# Patient Record
Sex: Female | Born: 1940 | Race: White | Hispanic: No | Smoking: Never smoker
Health system: Southern US, Community
[De-identification: ages and names within clinical notes are randomized; demographics above are authoritative.]

## PROBLEM LIST (undated history)

## (undated) DIAGNOSIS — S82002A Unspecified fracture of left patella, initial encounter for closed fracture: Secondary | ICD-10-CM

## (undated) DIAGNOSIS — Z87898 Personal history of other specified conditions: Secondary | ICD-10-CM

## (undated) DIAGNOSIS — K649 Unspecified hemorrhoids: Secondary | ICD-10-CM

## (undated) DIAGNOSIS — I10 Essential (primary) hypertension: Secondary | ICD-10-CM

## (undated) DIAGNOSIS — M1990 Unspecified osteoarthritis, unspecified site: Secondary | ICD-10-CM

## (undated) DIAGNOSIS — J45909 Unspecified asthma, uncomplicated: Secondary | ICD-10-CM

## (undated) DIAGNOSIS — S060XAA Concussion with loss of consciousness status unknown, initial encounter: Secondary | ICD-10-CM

## (undated) DIAGNOSIS — S060X9A Concussion with loss of consciousness of unspecified duration, initial encounter: Secondary | ICD-10-CM

## (undated) HISTORY — PX: WISDOM TOOTH EXTRACTION: SHX21

## (undated) HISTORY — PX: NASAL SEPTOPLASTY W/ TURBINOPLASTY: SHX2070

## (undated) HISTORY — PX: BREAST SURGERY: SHX581

## (undated) HISTORY — PX: HEMORRHOID BANDING: SHX5850

## (undated) HISTORY — PX: COLONOSCOPY: SHX174

---

## 2015-03-07 ENCOUNTER — Ambulatory Visit: Payer: Self-pay | Admitting: Orthopedic Surgery

## 2015-03-07 NOTE — Progress Notes (Signed)
Preoperative surgical orders have been place into the Epic hospital system for Darlene Johnson on 03/07/2015, 10:24 AM  by Patrica Duel for surgery on 03-20-2015.  Preop Total Hip - Anterior Approach orders including IV Tylenol, and IV Decadron as long as there are no contraindications to the above medications. Darlene Peace, PA-C

## 2015-03-10 ENCOUNTER — Ambulatory Visit: Payer: Self-pay | Admitting: Orthopedic Surgery

## 2015-03-10 NOTE — H&P (Signed)
Darlene Johnson DOB: February 12, 1940 Divorced / Language: English / Race: White Female Date of Admission:  03/20/2015 CC:  Right Hip Pain History of Present Illness  The patient is a 75 year old female who comes in for a preoperative History and Physical. The patient is scheduled for a right total hip arthroplasty (anterior) to be performed by Dr. Gus Rankin. Aluisio, MD at [facility] on 03-20-2015. The patient is a 75 year old female who presents with a hip problem. The patient reports right hip problems including pain and stiffness symptoms that have been present for year(s). The symptoms began without any known injury. Symptoms reported include hip pain, difficulty flexing hip, difficulty rotating hip and difficulty ambulating (especially with steps) The patient reports symptoms radiating to the: right thigh anteriorly and lower back and right flank area.The patient feels as if their symptoms are does feel they are worsening. Current treatment includes nonsteroidal anti-inflammatory drugs (Advil, prn). Prior to being seen today the patient was previously evaluated by a colleague Fish farm manager) 3 year(s) ago. Previous workup for this problem has included hip x-rays. Previous treatment for this problem has included physical therapy (she states she has continued with HEP). Unfortunately, right hip hs gotten progressively worse. It is at a point where it is bothering her at all times. She is having a much more difficult time getting around and doing things she desires. She is even having more difficulty with activities of daily living. Left hip bothers her also, but to much lesser degree. She has been told in the past that she needed hip replacement. She has been seen and felt to benefit from surgery. She would like to proceed with surgery at this time. They have been treated conservatively in the past for the above stated problem and despite conservative measures, they continue to have progressive pain  and severe functional limitations and dysfunction. They have failed non-operative management including home exercise, medications. It is felt that they would benefit from undergoing total joint replacement. Risks and benefits of the procedure have been discussed with the patient and they elect to proceed with surgery. There are no active contraindications to surgery such as ongoing infection or rapidly progressive neurological disease.  Problem List/Past Medical Primary osteoarthritis of right hip (M16.11)  Cardiac Arrhythmia  Palpitations  High blood pressure  Asthma  Childhood Hemorrhoids - Internal Menopause  Measles  Left Leg Fracture  Childhood  Allergies No Known Drug Allergies  Family History  Cerebrovascular Accident  Maternal Grandmother, Paternal Grandfather. Heart Disease  Paternal Grandmother. Hypertension  Father, Paternal Emelia Loron, Paternal Grandmother. Osteoarthritis  Father, Paternal Grandmother.  Social History  Children  1 Current drinker  11/01/2014: Currently drinks wine only occasionally per week Current work status  retired Scientist, physiological daily; does other Living situation  live alone Marital status  divorced No history of drug/alcohol rehab  Not under pain contract  Number of flights of stairs before winded  2-3 Tobacco / smoke exposure  11/01/2014: no Tobacco use  Never smoker. 11/01/2014 Post-Surgical Plans Home Advance Directives  Living Will  Medication History Hydrochlorothiazide (  Tablet, Oral) Active. Klor-Con M20 ( Tablet ER, Oral) Active. Advil (  Tablet, Oral) Active. Stool Softener (Oral) Specific strength unknown - Active. Cetirizine HCl (  Tablet, Oral) Active. Glucosamine (Oral) Specific strength unknown - Active. Flax Seed Oil (  Capsule, Oral) Active. Calcium+D3 Gradual Release (Oral) Specific strength unknown - Active. Vitamin D3 (1000UNIT Tablet, Oral)  Active. Turmeric (Oral) Specific strength unknown - Active. Metamucil (  Oral) Specific strength unknown - Active. Vitamin B12 TR (1000MCG Tablet ER, Oral) Active.  Past Surgical Histor Breast Biopsy  Date: 1975. left Sinus Surgery  Date: 2013. Septoplasty   Review of Systems General Not Present- Chills, Fatigue, Fever, Memory Loss, Night Sweats, Weight Gain and Weight Loss. Skin Not Present- Eczema, Hives, Itching, Lesions and Rash. HEENT Not Present- Dentures, Double Vision, Headache, Hearing Loss, Tinnitus and Visual Loss. Respiratory Present- Allergies. Not Present- Chronic Cough, Coughing up blood, Shortness of breath at rest and Shortness of breath with exertion. Cardiovascular Not Present- Chest Pain, Difficulty Breathing Lying Down, Murmur, Palpitations, Racing/skipping heartbeats and Swelling. Gastrointestinal Not Present- Abdominal Pain, Bloody Stool, Constipation, Diarrhea, Difficulty Swallowing, Heartburn, Jaundice, Loss of appetitie, Nausea and Vomiting. Female Genitourinary Present- Urinating at Night. Not Present- Blood in Urine, Discharge, Flank Pain, Incontinence, Painful Urination, Urgency, Urinary frequency, Urinary Retention and Weak urinary stream. Musculoskeletal Present- Back Pain and Joint Pain. Not Present- Joint Swelling, Morning Stiffness, Muscle Pain, Muscle Weakness and Spasms. Neurological Not Present- Blackout spells, Difficulty with balance, Dizziness, Paralysis, Tremor and Weakness. Psychiatric Not Present- Insomnia.  Vitals Weight: 165 lb Height: 62.5in Weight was reported by patient. Height was reported by patient. Body Surface Area: 1.77 m Body Mass Index: 29.7 kg/m  BP: 128/68 (Sitting, Right Arm, Standard)   Physical Exam General Mental Status -Alert, cooperative and good historian. General Appearance-pleasant, Not in acute distress. Orientation-Oriented X3. Build & Nutrition-Well nourished and Well developed.  Head and  Neck Head-normocephalic, atraumatic . Neck Global Assessment - supple, no bruit auscultated on the right, no bruit auscultated on the left.  Eye Pupil - Bilateral-Regular and Round. Motion - Bilateral-EOMI.  Chest and Lung Exam Auscultation Breath sounds - clear at anterior chest wall and clear at posterior chest wall. Adventitious sounds - No Adventitious sounds.  Cardiovascular Auscultation Rhythm - Regular rate and rhythm. Heart Sounds - S1 WNL and S2 WNL. Murmurs & Other Heart Sounds - Auscultation of the heart reveals - No Murmurs.  Abdomen Palpation/Percussion Tenderness - Abdomen is non-tender to palpation. Rigidity (guarding) - Abdomen is soft. Auscultation Auscultation of the abdomen reveals - Bowel sounds normal.  Female Genitourinary Note: Not done, not pertinent to present illness   Musculoskeletal Note: On exam, a well-developed female, alert and oriented, in no apparent distress. Evaluation of her left hip flexion about 110, rotation in 10, out 30, abduction 30 with discomfort on range of motion. Right hip flexion is about 90. No internal or external rotation and about 5 degrees of abduction. Her knee exams are normal. Pulses, sensation and motor are intact. Gait pattern is significantly antalgic.  RADIOGRAPHS AP pelvis and lateral of the right hip show advanced end-stage arthritis of that right hip, bone-on-bone with subchondral cystic formation and large osteophyte formation. She also has degenerative change on the left, but to a lesser degree.  Assessment & Plan  Primary osteoarthritis of right hip (M16.11)  Note:Surgical Plans: Right Total Hip Replacement - Anterior Approach  Disposition: Home - Patient lives in [place], [place].  PCP: Dr. Jannach - Patient has been seen preoperatively and felt to be stable for surgery. Cards: Dr. Miller - Patient has been seen preoperatively and felt to be stable for surgery. "she is low risk for a cardiac event  int he next year and can go ahead and have her hip surgery as indicated."  IV TXA  Anesthesia Issues: Nausea and vomiting following anesthesia in the past.  Signed electronically by Brayden Betters L Tanza Pellot, III PA-C 

## 2015-03-13 ENCOUNTER — Encounter (HOSPITAL_COMMUNITY)
Admission: RE | Admit: 2015-03-13 | Discharge: 2015-03-13 | Disposition: A | Discharge status: Home / Self Care | Payer: Medicare Other | Source: Ambulatory Visit | Attending: Orthopedic Surgery | Admitting: Orthopedic Surgery

## 2015-03-13 ENCOUNTER — Encounter (HOSPITAL_COMMUNITY): Payer: Self-pay

## 2015-03-13 DIAGNOSIS — Z01812 Encounter for preprocedural laboratory examination: Secondary | ICD-10-CM | POA: Insufficient documentation

## 2015-03-13 DIAGNOSIS — Z0183 Encounter for blood typing: Secondary | ICD-10-CM | POA: Insufficient documentation

## 2015-03-13 DIAGNOSIS — I10 Essential (primary) hypertension: Secondary | ICD-10-CM | POA: Insufficient documentation

## 2015-03-13 DIAGNOSIS — M1611 Unilateral primary osteoarthritis, right hip: Secondary | ICD-10-CM | POA: Insufficient documentation

## 2015-03-13 DIAGNOSIS — Z79899 Other long term (current) drug therapy: Secondary | ICD-10-CM | POA: Diagnosis not present

## 2015-03-13 HISTORY — DX: Unspecified hemorrhoids: K64.9

## 2015-03-13 HISTORY — DX: Personal history of other specified conditions: Z87.898

## 2015-03-13 HISTORY — DX: Unspecified asthma, uncomplicated: J45.909

## 2015-03-13 HISTORY — DX: Unspecified osteoarthritis, unspecified site: M19.90

## 2015-03-13 HISTORY — DX: Essential (primary) hypertension: I10

## 2015-03-13 HISTORY — DX: Concussion with loss of consciousness of unspecified duration, initial encounter: S06.0X9A

## 2015-03-13 HISTORY — DX: Concussion with loss of consciousness status unknown, initial encounter: S06.0XAA

## 2015-03-13 HISTORY — DX: Unspecified fracture of left patella, initial encounter for closed fracture: S82.002A

## 2015-03-13 LAB — COMPREHENSIVE METABOLIC PANEL
ALBUMIN: 4.3 g/dL (ref 3.5–5.0)
ALT: 17 U/L (ref 14–54)
ANION GAP: 8 (ref 5–15)
AST: 24 U/L (ref 15–41)
Alkaline Phosphatase: 85 U/L (ref 38–126)
BUN: 17 mg/dL (ref 6–20)
CO2: 28 mmol/L (ref 22–32)
Calcium: 9.2 mg/dL (ref 8.9–10.3)
Chloride: 103 mmol/L (ref 101–111)
Creatinine, Ser: 0.76 mg/dL (ref 0.44–1.00)
GFR calc Af Amer: >60 mL/min (ref >60)
GFR calc non Af Amer: >60 mL/min (ref >60)
GLUCOSE: 101 mg/dL — AB (ref 65–99)
POTASSIUM: 4.3 mmol/L (ref 3.5–5.1)
SODIUM: 139 mmol/L (ref 135–145)
Total Bilirubin: 0.8 mg/dL (ref 0.3–1.2)
Total Protein: 6.9 g/dL (ref 6.5–8.1)

## 2015-03-13 LAB — CBC
HCT: 47.1 % — ABNORMAL HIGH (ref 36.0–46.0)
HEMOGLOBIN: 15.4 g/dL — AB (ref 12.0–15.0)
MCH: 28.8 pg (ref 26.0–34.0)
MCHC: 32.7 g/dL (ref 30.0–36.0)
MCV: 88.2 fL (ref 78.0–100.0)
Platelets: 175 10*3/uL (ref 150–400)
RBC: 5.34 MIL/uL — ABNORMAL HIGH (ref 3.87–5.11)
RDW: 13.6 % (ref 11.5–15.5)
WBC: 7.1 10*3/uL (ref 4.0–10.5)

## 2015-03-13 LAB — APTT: APTT: 29 s (ref 24–37)

## 2015-03-13 LAB — ABO/RH: ABO/RH(D): O NEG

## 2015-03-13 LAB — URINALYSIS, ROUTINE W REFLEX MICROSCOPIC
BILIRUBIN URINE: NEGATIVE
Glucose, UA: NEGATIVE mg/dL
HGB URINE DIPSTICK: NEGATIVE
KETONES UR: NEGATIVE mg/dL
Leukocytes, UA: NEGATIVE
Nitrite: NEGATIVE
PROTEIN: NEGATIVE mg/dL
Specific Gravity, Urine: 1.02 (ref 1.005–1.030)
pH: 7.5 (ref 5.0–8.0)

## 2015-03-13 LAB — SURGICAL PCR SCREEN
MRSA, PCR: NEGATIVE
STAPHYLOCOCCUS AUREUS: NEGATIVE

## 2015-03-13 LAB — PROTIME-INR
INR: 1.08 (ref 0.00–1.49)
Prothrombin Time: 14.2 seconds (ref 11.6–15.2)

## 2015-03-13 NOTE — Pre-Procedure Instructions (Signed)
EKG 8'16, Echo/Stress 11'16, CXR 11'16 with chart. Clearance 12-11-14(Dr. Miller,cardiology), 01-07-15 (Dr. Loni Dolly) with chart.

## 2015-03-13 NOTE — Patient Instructions (Signed)
Darlene Johnson  03/13/2015   Your procedure is scheduled on: 03-20-15   Report to [facility]  Entrance take [facility]  elevators to 3rd floor to  Short Stay Center at 0630 AM.  Call this number if you have problems the morning of surgery [phone]   Remember: ONLY 1 PERSON MAY GO WITH YOU TO SHORT STAY TO GET  READY MORNING OF YOUR SURGERY.  Do not eat food or drink liquids :After Midnight.     Take these medicines the morning of surgery with A SIP OF WATER: Cetirizine -if need. DO NOT TAKE ANY DIABETIC MEDICATIONS DAY OF YOUR SURGERY                               You may not have any metal on your body including hair pins and              piercings  Do not wear jewelry, make-up, lotions, powders or perfumes, deodorant             Do not wear nail polish.  Do not shave  48 hours prior to surgery.              Men may shave face and neck.   Do not bring valuables to the hospital. [facility] IS NOT             RESPONSIBLE   FOR VALUABLES.  Contacts, dentures or bridgework may not be worn into surgery.  Leave suitcase in the car. After surgery it may be brought to your room.     Patients discharged the day of surgery will not be allowed to drive home.  Name and phone number of your driver: daughter -Darlene Johnson [phone]  Special Instructions: N/A              Please read over the following fact sheets you were given: _____________________________________________________________________             [facility] - Preparing for Surgery Before surgery, you can play an important role.  Because skin is not sterile, your skin needs to be as free of germs as possible.  You can reduce the number of germs on your skin by washing with CHG (chlorahexidine gluconate) soap before surgery.  CHG is an antiseptic cleaner which kills germs and bonds with the skin to continue killing germs even after washing. Please DO NOT use if you have an allergy to CHG or  antibacterial soaps.  If your skin becomes reddened/irritated stop using the CHG and inform your nurse when you arrive at Short Stay. Do not shave (including legs and underarms) for at least 48 hours prior to the first CHG shower.  You may shave your face/neck. Please follow these instructions carefully:  1.  Shower with CHG Soap the night before surgery and the  morning of Surgery.  2.  If you choose to wash your hair, wash your hair first as usual with your  normal  shampoo.  3.  After you shampoo, rinse your hair and body thoroughly to remove the  shampoo.                           4.  Use CHG as you would any other liquid soap.  You can apply chg directly  to the skin and wash                       Gently with a scrungie or clean washcloth.  5.  Apply the CHG Soap to your body ONLY FROM THE NECK DOWN.   Do not use on face/ open                           Wound or open sores. Avoid contact with eyes, ears mouth and genitals (private parts).                       Wash face,  Genitals (private parts) with your normal soap.             6.  Wash thoroughly, paying special attention to the area where your surgery  will be performed.  7.  Thoroughly rinse your body with warm water from the neck down.  8.  DO NOT shower/wash with your normal soap after using and rinsing off  the CHG Soap.                9.  Pat yourself dry with a clean towel.            10.  Wear clean pajamas.            11.  Place clean sheets on your bed the night of your first shower and do not  sleep with pets. Day of Surgery : Do not apply any lotions/deodorants the morning of surgery.  Please wear clean clothes to the hospital/surgery center.  FAILURE TO FOLLOW THESE INSTRUCTIONS MAY RESULT IN THE CANCELLATION OF YOUR SURGERY PATIENT SIGNATURE_________________________________  NURSE SIGNATURE__________________________________  ________________________________________________________________________   Darlene Johnson  An incentive spirometer is a tool that can help keep your lungs clear and active. This tool measures how well you are filling your lungs with each breath. Taking long deep breaths may help reverse or decrease the chance of developing breathing (pulmonary) problems (especially infection) following:  A long period of time when you are unable to move or be active. BEFORE THE PROCEDURE   If the spirometer includes an indicator to show your best effort, your nurse or respiratory therapist will set it to a desired goal.  If possible, sit up straight or lean slightly forward. Try not to slouch.  Hold the incentive spirometer in an upright position. INSTRUCTIONS FOR USE   Sit on the edge of your bed if possible, or sit up as far as you can in bed or on a chair.  Hold the incentive spirometer in an upright position.  Breathe out normally.  Place the mouthpiece in your mouth and seal your lips tightly around it.  Breathe in slowly and as deeply as possible, raising the piston or the ball toward the top of the column.  Hold your breath for 3-5 seconds or for as long as possible. Allow the piston or ball to fall to the bottom of the column.  Remove the mouthpiece from your mouth and breathe out normally.  Rest for a few seconds and repeat Steps 1 through 7 at least 10 times every 1-2 hours when you are awake. Take your time and take a few normal breaths between deep breaths.  The spirometer may include an indicator to show your best effort. Use the indicator as a goal to work toward during each repetition.  After each  set of 10 deep breaths, practice coughing to be sure your lungs are clear. If you have an incision (the cut made at the time of surgery), support your incision when coughing by placing a pillow or rolled up towels firmly against it. Once you are able to get out of bed, walk around indoors and cough well. You may stop using the incentive spirometer when instructed by  your caregiver.  RISKS AND COMPLICATIONS  Take your time so you do not get dizzy or light-headed.  If you are in pain, you may need to take or ask for pain medication before doing incentive spirometry. It is harder to take a deep breath if you are having pain. AFTER USE  Rest and breathe slowly and easily.  It can be helpful to keep track of a log of your progress. Your caregiver can provide you with a simple table to help with this. If you are using the spirometer at home, follow these instructions: [place] IF:   You are having difficultly using the spirometer.  You have trouble using the spirometer as often as instructed.  Your pain medication is not giving enough relief while using the spirometer.  You develop fever of 100.5 F (38.1 C) or higher. SEEK IMMEDIATE MEDICAL CARE IF:   You cough up bloody sputum that had not been present before.  You develop fever of 102 F (38.9 C) or greater.  You develop worsening pain at or near the incision site. MAKE SURE YOU:   Understand these instructions.  Will watch your condition.  Will get help right away if you are not doing well or get worse. Document Released: 06/08/2006 Document Revised: 04/20/2011 Document Reviewed: 08/09/2006 Exit[employer] Patient Information 2014 Exit[employer], [place].   ________________________________________________________________________  WHAT IS A BLOOD TRANSFUSION? Blood Transfusion Information  A transfusion is the replacement of blood or some of its parts. Blood is made up of multiple cells which provide different functions.  Red blood cells carry oxygen and are used for blood loss replacement.  White blood cells fight against infection.  Platelets control bleeding.  Plasma helps clot blood.  Other blood products are available for specialized needs, such as hemophilia or other clotting disorders. BEFORE THE TRANSFUSION  Who gives blood for transfusions?   Healthy volunteers who are  fully evaluated to make sure their blood is safe. This is blood bank blood. Transfusion therapy is the safest it has ever been in the practice of medicine. Before blood is taken from a donor, a complete history is taken to make sure that person has no history of diseases nor engages in risky social behavior (examples are intravenous drug use or sexual activity with multiple partners). The donor's travel history is screened to minimize risk of transmitting infections, such as malaria. The donated blood is tested for signs of infectious diseases, such as HIV and hepatitis. The blood is then tested to be sure it is compatible with you in order to minimize the chance of a transfusion reaction. If you or a relative donates blood, this is often done in anticipation of surgery and is not appropriate for emergency situations. It takes many days to process the donated blood. RISKS AND COMPLICATIONS Although transfusion therapy is very safe and saves many lives, the main dangers of transfusion include:   Getting an infectious disease.  Developing a transfusion reaction. This is an allergic reaction to something in the blood you were given. Every precaution is taken to prevent this. The decision to have  a blood transfusion has been considered carefully by your caregiver before blood is given. Blood is not given unless the benefits outweigh the risks. AFTER THE TRANSFUSION  Right after receiving a blood transfusion, you will usually feel much better and more energetic. This is especially true if your red blood cells have gotten low (anemic). The transfusion raises the level of the red blood cells which carry oxygen, and this usually causes an energy increase.  The nurse administering the transfusion will monitor you carefully for complications. HOME CARE INSTRUCTIONS  No special instructions are needed after a transfusion. You may find your energy is better. Speak with your caregiver about any limitations on  activity for underlying diseases you may have. SEEK MEDICAL CARE IF:   Your condition is not improving after your transfusion.  You develop redness or irritation at the intravenous (IV) site. SEEK IMMEDIATE MEDICAL CARE IF:  Any of the following symptoms occur over the next 12 hours:  Shaking chills.  You have a temperature by mouth above 102 F (38.9 C), not controlled by medicine.  Chest, back, or muscle pain.  People around you feel you are not acting correctly or are confused.  Shortness of breath or difficulty breathing.  Dizziness and fainting.  You get a rash or develop hives.  You have a decrease in urine output.  Your urine turns a dark color or changes to pink, red, or brown. Any of the following symptoms occur over the next 10 days:  You have a temperature by mouth above 102 F (38.9 C), not controlled by medicine.  Shortness of breath.  Weakness after normal activity.  The white part of the eye turns yellow (jaundice).  You have a decrease in the amount of urine or are urinating less often.  Your urine turns a dark color or changes to pink, red, or brown. Document Released: 01/24/2000 Document Revised: 04/20/2011 Document Reviewed: 09/12/2007 [facility] Patient Information 2014 [place], [place].  _______________________________________________________________________

## 2015-03-20 ENCOUNTER — Encounter (HOSPITAL_COMMUNITY)
Admission: RE | Disposition: A | Discharge status: Home Health | Payer: Self-pay | Source: Ambulatory Visit | Attending: Orthopedic Surgery

## 2015-03-20 ENCOUNTER — Inpatient Hospital Stay (HOSPITAL_COMMUNITY): Payer: Medicare Other | Admitting: Certified Registered"

## 2015-03-20 ENCOUNTER — Encounter (HOSPITAL_COMMUNITY): Payer: Self-pay | Admitting: *Deleted

## 2015-03-20 ENCOUNTER — Inpatient Hospital Stay (HOSPITAL_COMMUNITY)
Admission: RE | Admit: 2015-03-20 | Discharge: 2015-03-21 | DRG: 470 | Disposition: A | Discharge status: Home Health | Payer: Medicare Other | Source: Ambulatory Visit | Attending: Orthopedic Surgery | Admitting: Orthopedic Surgery

## 2015-03-20 ENCOUNTER — Inpatient Hospital Stay (HOSPITAL_COMMUNITY): Payer: Medicare Other

## 2015-03-20 DIAGNOSIS — Z79899 Other long term (current) drug therapy: Secondary | ICD-10-CM | POA: Diagnosis not present

## 2015-03-20 DIAGNOSIS — Z8249 Family history of ischemic heart disease and other diseases of the circulatory system: Secondary | ICD-10-CM

## 2015-03-20 DIAGNOSIS — M1611 Unilateral primary osteoarthritis, right hip: Principal | ICD-10-CM | POA: Diagnosis present

## 2015-03-20 DIAGNOSIS — I10 Essential (primary) hypertension: Secondary | ICD-10-CM | POA: Diagnosis present

## 2015-03-20 DIAGNOSIS — Z96649 Presence of unspecified artificial hip joint: Secondary | ICD-10-CM

## 2015-03-20 DIAGNOSIS — M25551 Pain in right hip: Secondary | ICD-10-CM | POA: Diagnosis present

## 2015-03-20 DIAGNOSIS — M169 Osteoarthritis of hip, unspecified: Secondary | ICD-10-CM | POA: Diagnosis present

## 2015-03-20 DIAGNOSIS — Z01812 Encounter for preprocedural laboratory examination: Secondary | ICD-10-CM | POA: Diagnosis not present

## 2015-03-20 HISTORY — PX: TOTAL HIP ARTHROPLASTY: SHX124

## 2015-03-20 LAB — TYPE AND SCREEN
ABO/RH(D): O NEG
ANTIBODY SCREEN: NEGATIVE

## 2015-03-20 SURGERY — ARTHROPLASTY, HIP, TOTAL, ANTERIOR APPROACH
Anesthesia: Spinal | Site: Hip | Laterality: Right

## 2015-03-20 MED ORDER — ONDANSETRON HCL 4 MG PO TABS: 4.0000 mg | ORAL_TABLET | Freq: Four times a day (QID) | ORAL | Status: DC | PRN

## 2015-03-20 MED ORDER — PROMETHAZINE HCL 25 MG/ML IJ SOLN: 6.2500 mg | INTRAMUSCULAR | Status: DC | PRN

## 2015-03-20 MED ORDER — MIDAZOLAM HCL 5 MG/5ML IJ SOLN
INTRAMUSCULAR | Status: DC | PRN
  Administered 2015-03-20: 2 mg via INTRAVENOUS

## 2015-03-20 MED ORDER — ONDANSETRON HCL 4 MG/2ML IJ SOLN: 4.0000 mg | Freq: Four times a day (QID) | INTRAMUSCULAR | Status: DC | PRN

## 2015-03-20 MED ORDER — BUPIVACAINE HCL (PF) 0.25 % IJ SOLN
INTRAMUSCULAR | Status: DC | PRN
  Administered 2015-03-20: 30 mL

## 2015-03-20 MED ORDER — PHENOL 1.4 % MT LIQD: 1.0000 | OROMUCOSAL | Status: DC | PRN

## 2015-03-20 MED ORDER — POLYETHYLENE GLYCOL 3350 17 G PO PACK: 17.0000 g | PACK | Freq: Every day | ORAL | Status: DC | PRN

## 2015-03-20 MED ORDER — TRANEXAMIC ACID 1000 MG/10ML IV SOLN
1000.0000 mg | INTRAVENOUS | Status: AC
  Administered 2015-03-20: 1000 mg via INTRAVENOUS
  Filled 2015-03-20: qty 10

## 2015-03-20 MED ORDER — ONDANSETRON HCL 4 MG/2ML IJ SOLN
INTRAMUSCULAR | Status: DC | PRN
  Administered 2015-03-20 (×2): 2 mg via INTRAVENOUS

## 2015-03-20 MED ORDER — FENTANYL CITRATE (PF) 100 MCG/2ML IJ SOLN: 25.0000 ug | INTRAMUSCULAR | Status: DC | PRN

## 2015-03-20 MED ORDER — CEFAZOLIN SODIUM-DEXTROSE 2-3 GM-% IV SOLR
INTRAVENOUS | Status: AC
  Filled 2015-03-20: qty 50

## 2015-03-20 MED ORDER — PROPOFOL 500 MG/50ML IV EMUL
INTRAVENOUS | Status: DC | PRN
  Administered 2015-03-20: 100 ug/kg/min via INTRAVENOUS

## 2015-03-20 MED ORDER — ACETAMINOPHEN 10 MG/ML IV SOLN
INTRAVENOUS | Status: AC
  Filled 2015-03-20: qty 100

## 2015-03-20 MED ORDER — PHENYLEPHRINE HCL 10 MG/ML IJ SOLN
INTRAMUSCULAR | Status: DC | PRN
  Administered 2015-03-20: 40 ug via INTRAVENOUS
  Administered 2015-03-20: 80 ug via INTRAVENOUS
  Administered 2015-03-20: 40 ug via INTRAVENOUS
  Administered 2015-03-20 (×4): 80 ug via INTRAVENOUS
  Administered 2015-03-20: 40 ug via INTRAVENOUS

## 2015-03-20 MED ORDER — PHENYLEPHRINE 40 MCG/ML (10ML) SYRINGE FOR IV PUSH (FOR BLOOD PRESSURE SUPPORT)
PREFILLED_SYRINGE | INTRAVENOUS | Status: AC
  Filled 2015-03-20: qty 10

## 2015-03-20 MED ORDER — STERILE WATER FOR IRRIGATION IR SOLN
Status: DC | PRN
  Administered 2015-03-20: 1000 mL

## 2015-03-20 MED ORDER — POLYVINYL ALCOHOL 1.4 % OP SOLN
1.0000 [drp] | Freq: Two times a day (BID) | OPHTHALMIC | Status: DC
  Administered 2015-03-20: 1 [drp] via OPHTHALMIC
  Filled 2015-03-20: qty 15

## 2015-03-20 MED ORDER — ACETAMINOPHEN 500 MG PO TABS
1000.0000 mg | ORAL_TABLET | Freq: Four times a day (QID) | ORAL | Status: AC
  Administered 2015-03-20 – 2015-03-21 (×4): 1000 mg via ORAL
  Filled 2015-03-20 (×4): qty 2

## 2015-03-20 MED ORDER — SODIUM CHLORIDE 0.9 % IV SOLN
INTRAVENOUS | Status: DC
  Administered 2015-03-20 – 2015-03-21 (×2): via INTRAVENOUS

## 2015-03-20 MED ORDER — HYDROCHLOROTHIAZIDE 25 MG PO TABS
25.0000 mg | ORAL_TABLET | Freq: Every day | ORAL | Status: DC
  Administered 2015-03-21: 25 mg via ORAL
  Filled 2015-03-20: qty 1

## 2015-03-20 MED ORDER — DEXAMETHASONE SODIUM PHOSPHATE 10 MG/ML IJ SOLN
10.0000 mg | Freq: Once | INTRAMUSCULAR | Status: AC
  Administered 2015-03-21: 10 mg via INTRAVENOUS
  Filled 2015-03-20: qty 1

## 2015-03-20 MED ORDER — ACETAMINOPHEN 650 MG RE SUPP
650.0000 mg | Freq: Four times a day (QID) | RECTAL | Status: DC | PRN
Start: 2015-03-21 — End: 2015-03-21

## 2015-03-20 MED ORDER — ACETAMINOPHEN 325 MG PO TABS: 650.0000 mg | ORAL_TABLET | Freq: Four times a day (QID) | ORAL | Status: DC | PRN

## 2015-03-20 MED ORDER — POTASSIUM CHLORIDE CRYS ER 20 MEQ PO TBCR
20.0000 meq | EXTENDED_RELEASE_TABLET | Freq: Every day | ORAL | Status: DC
Start: 2015-03-21 — End: 2015-03-21
  Administered 2015-03-21: 20 meq via ORAL
  Filled 2015-03-20: qty 1

## 2015-03-20 MED ORDER — FLEET ENEMA 7-19 GM/118ML RE ENEM: 1.0000 | ENEMA | Freq: Once | RECTAL | Status: DC | PRN

## 2015-03-20 MED ORDER — DIPHENHYDRAMINE HCL 12.5 MG/5ML PO ELIX: 12.5000 mg | ORAL_SOLUTION | ORAL | Status: DC | PRN

## 2015-03-20 MED ORDER — CHLORHEXIDINE GLUCONATE 4 % EX LIQD: 60.0000 mL | Freq: Once | CUTANEOUS | Status: DC

## 2015-03-20 MED ORDER — MIDAZOLAM HCL 2 MG/2ML IJ SOLN
INTRAMUSCULAR | Status: AC
  Filled 2015-03-20: qty 2

## 2015-03-20 MED ORDER — METOCLOPRAMIDE HCL 5 MG/ML IJ SOLN
5.0000 mg | Freq: Three times a day (TID) | INTRAMUSCULAR | Status: DC | PRN
  Administered 2015-03-20: 10 mg via INTRAVENOUS
  Filled 2015-03-20: qty 2

## 2015-03-20 MED ORDER — DEXAMETHASONE SODIUM PHOSPHATE 10 MG/ML IJ SOLN
10.0000 mg | Freq: Once | INTRAMUSCULAR | Status: AC
  Administered 2015-03-20: 10 mg via INTRAVENOUS

## 2015-03-20 MED ORDER — MEPERIDINE HCL 50 MG/ML IJ SOLN: 6.2500 mg | INTRAMUSCULAR | Status: DC | PRN

## 2015-03-20 MED ORDER — KETOROLAC TROMETHAMINE 15 MG/ML IJ SOLN
INTRAMUSCULAR | Status: AC
  Filled 2015-03-20: qty 1

## 2015-03-20 MED ORDER — DOCUSATE SODIUM 100 MG PO CAPS
100.0000 mg | ORAL_CAPSULE | Freq: Two times a day (BID) | ORAL | Status: DC
  Administered 2015-03-20 – 2015-03-21 (×2): 100 mg via ORAL

## 2015-03-20 MED ORDER — EPHEDRINE SULFATE 50 MG/ML IJ SOLN
INTRAMUSCULAR | Status: AC
Start: 2015-03-20 — End: 2015-03-20
  Filled 2015-03-20: qty 1

## 2015-03-20 MED ORDER — BISACODYL 10 MG RE SUPP: 10.0000 mg | Freq: Every day | RECTAL | Status: DC | PRN

## 2015-03-20 MED ORDER — RIVAROXABAN 10 MG PO TABS
10.0000 mg | ORAL_TABLET | Freq: Every day | ORAL | Status: DC
  Administered 2015-03-21: 10 mg via ORAL
  Filled 2015-03-20 (×2): qty 1

## 2015-03-20 MED ORDER — LIDOCAINE HCL (CARDIAC) 20 MG/ML IV SOLN
INTRAVENOUS | Status: AC
  Filled 2015-03-20: qty 5

## 2015-03-20 MED ORDER — PROPOFOL 10 MG/ML IV BOLUS
INTRAVENOUS | Status: DC | PRN
  Administered 2015-03-20: 20 mg via INTRAVENOUS

## 2015-03-20 MED ORDER — PROPOFOL 10 MG/ML IV BOLUS
INTRAVENOUS | Status: AC
  Filled 2015-03-20: qty 40

## 2015-03-20 MED ORDER — BUPIVACAINE HCL (PF) 0.25 % IJ SOLN
INTRAMUSCULAR | Status: AC
Start: 2015-03-20 — End: 2015-03-20
  Filled 2015-03-20: qty 30

## 2015-03-20 MED ORDER — METHOCARBAMOL 500 MG PO TABS
500.0000 mg | ORAL_TABLET | Freq: Four times a day (QID) | ORAL | Status: DC | PRN
  Administered 2015-03-21: 500 mg via ORAL
  Filled 2015-03-20: qty 1

## 2015-03-20 MED ORDER — ONDANSETRON HCL 4 MG/2ML IJ SOLN
INTRAMUSCULAR | Status: AC
  Filled 2015-03-20: qty 2

## 2015-03-20 MED ORDER — LACTATED RINGERS IV SOLN
INTRAVENOUS | Status: DC | PRN
  Administered 2015-03-20 (×3): via INTRAVENOUS

## 2015-03-20 MED ORDER — KETOROLAC TROMETHAMINE 15 MG/ML IJ SOLN
7.5000 mg | Freq: Four times a day (QID) | INTRAMUSCULAR | Status: AC | PRN
Start: 2015-03-20 — End: 2015-03-21
  Administered 2015-03-20 (×2): 7.5 mg via INTRAVENOUS
  Filled 2015-03-20: qty 1

## 2015-03-20 MED ORDER — MENTHOL 3 MG MT LOZG: 1.0000 | LOZENGE | OROMUCOSAL | Status: DC | PRN

## 2015-03-20 MED ORDER — LORATADINE 10 MG PO TABS
10.0000 mg | ORAL_TABLET | Freq: Every day | ORAL | Status: DC
  Administered 2015-03-21: 10 mg via ORAL
  Filled 2015-03-20: qty 1

## 2015-03-20 MED ORDER — CEFAZOLIN SODIUM-DEXTROSE 2-3 GM-% IV SOLR
2.0000 g | INTRAVENOUS | Status: AC
  Administered 2015-03-20: 2 g via INTRAVENOUS

## 2015-03-20 MED ORDER — EPHEDRINE SULFATE 50 MG/ML IJ SOLN
INTRAMUSCULAR | Status: DC | PRN
  Administered 2015-03-20: 5 mg via INTRAVENOUS
  Administered 2015-03-20: 10 mg via INTRAVENOUS

## 2015-03-20 MED ORDER — 0.9 % SODIUM CHLORIDE (POUR BTL) OPTIME
TOPICAL | Status: DC | PRN
  Administered 2015-03-20: 1000 mL

## 2015-03-20 MED ORDER — PROPOFOL 10 MG/ML IV BOLUS
INTRAVENOUS | Status: AC
  Filled 2015-03-20: qty 20

## 2015-03-20 MED ORDER — MORPHINE SULFATE (PF) 2 MG/ML IV SOLN
1.0000 mg | INTRAVENOUS | Status: DC | PRN
  Administered 2015-03-20 (×2): 1 mg via INTRAVENOUS
  Filled 2015-03-20 (×2): qty 1

## 2015-03-20 MED ORDER — DEXAMETHASONE SODIUM PHOSPHATE 10 MG/ML IJ SOLN
INTRAMUSCULAR | Status: AC
  Filled 2015-03-20: qty 1

## 2015-03-20 MED ORDER — TRAMADOL HCL 50 MG PO TABS: 50.0000 mg | ORAL_TABLET | Freq: Four times a day (QID) | ORAL | Status: DC | PRN

## 2015-03-20 MED ORDER — SODIUM CHLORIDE 0.9 % IV SOLN: INTRAVENOUS | Status: DC

## 2015-03-20 MED ORDER — ACETAMINOPHEN 10 MG/ML IV SOLN
1000.0000 mg | Freq: Once | INTRAVENOUS | Status: AC
  Administered 2015-03-20: 1000 mg via INTRAVENOUS
  Filled 2015-03-20: qty 100

## 2015-03-20 MED ORDER — CEFAZOLIN SODIUM-DEXTROSE 2-3 GM-% IV SOLR
2.0000 g | Freq: Four times a day (QID) | INTRAVENOUS | Status: AC
  Administered 2015-03-20 (×2): 2 g via INTRAVENOUS
  Filled 2015-03-20 (×2): qty 50

## 2015-03-20 MED ORDER — OXYCODONE HCL 5 MG PO TABS
5.0000 mg | ORAL_TABLET | ORAL | Status: DC | PRN
  Administered 2015-03-20: 10 mg via ORAL
  Administered 2015-03-20 (×2): 5 mg via ORAL
  Administered 2015-03-21: 10 mg via ORAL
  Administered 2015-03-21: 5 mg via ORAL
  Administered 2015-03-21 (×2): 10 mg via ORAL
  Filled 2015-03-20: qty 2
  Filled 2015-03-20: qty 1
  Filled 2015-03-20: qty 2
  Filled 2015-03-20: qty 1
  Filled 2015-03-20 (×2): qty 2
  Filled 2015-03-20: qty 1

## 2015-03-20 MED ORDER — METHOCARBAMOL 1000 MG/10ML IJ SOLN
500.0000 mg | Freq: Four times a day (QID) | INTRAVENOUS | Status: DC | PRN
  Administered 2015-03-20: 500 mg via INTRAVENOUS
  Filled 2015-03-20 (×2): qty 5

## 2015-03-20 MED ORDER — METOCLOPRAMIDE HCL 10 MG PO TABS: 5.0000 mg | ORAL_TABLET | Freq: Three times a day (TID) | ORAL | Status: DC | PRN

## 2015-03-20 MED ORDER — BUPIVACAINE IN DEXTROSE 0.75-8.25 % IT SOLN
INTRATHECAL | Status: DC | PRN
  Administered 2015-03-20: 1.8 mL via INTRATHECAL

## 2015-03-20 SURGICAL SUPPLY — 39 items
BAG DECANTER FOR FLEXI CONT (MISCELLANEOUS) IMPLANT
BAG ZIPLOCK 12X15 (MISCELLANEOUS) IMPLANT
BLADE SAG 18X100X1.27 (BLADE) ×3 IMPLANT
CAPT HIP TOTAL 2 ×3 IMPLANT
CLOSURE WOUND 1/2 X4 (GAUZE/BANDAGES/DRESSINGS) ×2
CLOTH BEACON ORANGE TIMEOUT ST (SAFETY) ×3 IMPLANT
COVER PERINEAL POST (MISCELLANEOUS) ×3 IMPLANT
DECANTER SPIKE VIAL GLASS SM (MISCELLANEOUS) IMPLANT
DRAPE STERI IOBAN 125X83 (DRAPES) ×3 IMPLANT
DRAPE U-SHAPE 47X51 STRL (DRAPES) ×6 IMPLANT
DRSG ADAPTIC 3X8 NADH LF (GAUZE/BANDAGES/DRESSINGS) ×3 IMPLANT
DRSG MEPILEX BORDER 4X4 (GAUZE/BANDAGES/DRESSINGS) ×3 IMPLANT
DRSG MEPILEX BORDER 4X8 (GAUZE/BANDAGES/DRESSINGS) ×3 IMPLANT
DURAPREP 26ML APPLICATOR (WOUND CARE) ×3 IMPLANT
ELECT REM PT RETURN 9FT ADLT (ELECTROSURGICAL) ×3
ELECTRODE REM PT RTRN 9FT ADLT (ELECTROSURGICAL) ×1 IMPLANT
EVACUATOR 1/8 PVC DRAIN (DRAIN) ×3 IMPLANT
GLOVE BIO SURGEON STRL SZ 6.5 (GLOVE) ×2 IMPLANT
GLOVE BIO SURGEON STRL SZ7.5 (GLOVE) ×3 IMPLANT
GLOVE BIO SURGEON STRL SZ8 (GLOVE) ×6 IMPLANT
GLOVE BIO SURGEONS STRL SZ 6.5 (GLOVE) ×1
GLOVE BIOGEL PI IND STRL 6.5 (GLOVE) ×2 IMPLANT
GLOVE BIOGEL PI IND STRL 8 (GLOVE) ×2 IMPLANT
GLOVE BIOGEL PI INDICATOR 6.5 (GLOVE) ×4
GLOVE BIOGEL PI INDICATOR 8 (GLOVE) ×4
GLOVE SURG SS PI 6.5 STRL IVOR (GLOVE) ×3 IMPLANT
GOWN STRL REUS W/TWL LRG LVL3 (GOWN DISPOSABLE) ×3 IMPLANT
GOWN STRL REUS W/TWL XL LVL3 (GOWN DISPOSABLE) ×9 IMPLANT
PACK ANTERIOR HIP CUSTOM (KITS) ×3 IMPLANT
STRIP CLOSURE SKIN 1/2X4 (GAUZE/BANDAGES/DRESSINGS) ×4 IMPLANT
SUT ETHIBOND NAB CT1 #1 30IN (SUTURE) ×3 IMPLANT
SUT MNCRL AB 4-0 PS2 18 (SUTURE) ×3 IMPLANT
SUT VIC AB 2-0 CT1 27 (SUTURE) ×4
SUT VIC AB 2-0 CT1 TAPERPNT 27 (SUTURE) ×2 IMPLANT
SUT VLOC 180 0 24IN GS25 (SUTURE) ×3 IMPLANT
SYR 50ML LL SCALE MARK (SYRINGE) IMPLANT
TRAY FOLEY W/METER SILVER 14FR (SET/KITS/TRAYS/PACK) ×3 IMPLANT
TRAY FOLEY W/METER SILVER 16FR (SET/KITS/TRAYS/PACK) IMPLANT
YANKAUER SUCT BULB TIP 10FT TU (MISCELLANEOUS) ×3 IMPLANT

## 2015-03-20 NOTE — Op Note (Signed)
OPERATIVE REPORT  PREOPERATIVE DIAGNOSIS: Osteoarthritis of the Right hip.   POSTOPERATIVE DIAGNOSIS: Osteoarthritis of the Right  hip.   PROCEDURE: Right total hip arthroplasty, anterior approach.   SURGEON: Ollen Gross, MD   ASSISTANT: Avel Peace, PA-C  ANESTHESIA:  Spinal  ESTIMATED BLOOD LOSS:-300 ml    DRAINS: Hemovac x1.   COMPLICATIONS: None   CONDITION: PACU - hemodynamically stable.   BRIEF CLINICAL NOTE: Darlene Johnson is a 75 y.o. female who has advanced end-  stage arthritis of her Right  hip with progressively worsening pain and  dysfunction.The patient has failed nonoperative management and presents for  total hip arthroplasty.   PROCEDURE IN DETAIL: After successful administration of spinal  anesthetic, the traction boots for the [facility] bed were placed on both  feet and the patient was placed onto the [facility] bed, boots placed into the leg  holders. The Right hip was then isolated from the perineum with plastic  drapes and prepped and draped in the usual sterile fashion. ASIS and  greater trochanter were marked and a oblique incision was made, starting  at about 1 cm lateral and 2 cm distal to the ASIS and coursing towards  the anterior cortex of the femur. The skin was cut with a 10 blade  through subcutaneous tissue to the level of the fascia overlying the  tensor fascia lata muscle. The fascia was then incised in line with the  incision at the junction of the anterior third and posterior 2/3rd. The  muscle was teased off the fascia and then the interval between the TFL  and the rectus was developed. The Hohmann retractor was then placed at  the top of the femoral neck over the capsule. The vessels overlying the  capsule were cauterized and the fat on top of the capsule was removed.  A Hohmann retractor was then placed anterior underneath the rectus  femoris to give exposure to the entire anterior capsule. A T-shaped  capsulotomy was performed.  The edges were tagged and the femoral head  was identified.       Osteophytes are removed off the superior acetabulum.  The femoral neck was then cut in situ with an oscillating saw. Traction  was then applied to the left lower extremity utilizing the [facility]  traction. The femoral head was then removed. Retractors were placed  around the acetabulum and then circumferential removal of the labrum was  performed. Osteophytes were also removed. Reaming starts at 45 mm to  medialize and  Increased in 2 mm increments to 49 mm. We reamed in  approximately 40 degrees of abduction, 20 degrees anteversion. A 50 mm  pinnacle acetabular shell was then impacted in anatomic position under  fluoroscopic guidance with excellent purchase. We did not need to place  any additional dome screws. A 32 mm neutral + 4 marathon liner was then  placed into the acetabular shell.       The femoral lift was then placed along the lateral aspect of the femur  just distal to the vastus ridge. The leg was  externally rotated and capsule  was stripped off the inferior aspect of the femoral neck down to the  level of the lesser trochanter, this was done with electrocautery. The femur was lifted after this was performed. The  leg was then placed and extended in adducted position to essentially delivering the femur. We also removed the capsule superiorly and the  piriformis from the  piriformis fossa to gain excellent exposure of the  proximal femur. Rongeur was used to remove some cancellous bone to get  into the lateral portion of the proximal femur for placement of the  initial starter reamer. The starter broaches was placed  the starter broach  and was shown to go down the center of the canal. Broaching  with the  Corail system was then performed starting at size 8, coursing  Up to size 11. A size 11 had excellent torsional and rotational  and axial stability. The trial standard offset neck was then placed  with a 32 + 1  trial head. The hip was then reduced. We confirmed that  the stem was in the canal both on AP and lateral x-rays. It also has excellent sizing. The hip was reduced with outstanding stability through full extension, full external rotation,  and then flexion in adduction internal rotation. AP pelvis was taken  and the leg lengths were measured and found to be exactly equal. Hip  was then dislocated again and the femoral head and neck removed. The  femoral broach was removed. Size 11 Corail stem with a standard offset  neck was then impacted into the femur following native anteversion. Has  excellent purchase in the canal. Excellent torsional and rotational and  axial stability. It is confirmed to be in the canal on AP and lateral  fluoroscopic views. The 32 + 1 ceramic head was placed and the hip  reduced with outstanding stability. Again AP pelvis was taken and it  confirmed that the leg lengths were equal. The wound was then copiously  irrigated with saline solution and the capsule reattached and repaired  with Ethibond suture. 30 ml of .25% Bupivicaine injected into the capsule and into the edge of the tensor fascia lata as well as subcutaneous tissue. The fascia overlying the tensor fascia lata was  then closed with a running #1 V-Loc. Subcu was closed with interrupted  2-0 Vicryl and subcuticular running 4-0 Monocryl. Incision was cleaned  and dried. Steri-Strips and a bulky sterile dressing applied. Hemovac  drain was hooked to suction and then he was awakened and transported to  recovery in stable condition.        Please note that a surgical assistant was a medical necessity for this procedure to perform it in a safe and expeditious manner. Assistant was necessary to provide appropriate retraction of vital neurovascular structures and to prevent femoral fracture and allow for anatomic placement of the prosthesis.  Ollen Gross, M.D.

## 2015-03-20 NOTE — Anesthesia Postprocedure Evaluation (Signed)
Anesthesia Post Note  Patient: Darlene Johnson  Procedure(s) Performed: Procedure(s) (LRB): RIGHT TOTAL HIP ARTHROPLASTY ANTERIOR APPROACH (Right)  Patient location during evaluation: PACU Anesthesia Type: Spinal Level of consciousness: oriented and awake and alert Pain management: pain level controlled Vital Signs Assessment: post-procedure vital signs reviewed and stable Respiratory status: spontaneous breathing, respiratory function stable and patient connected to nasal cannula oxygen Cardiovascular status: blood pressure returned to baseline and stable Postop Assessment: no headache and no backache Anesthetic complications: no    Last Vitals:  Filed Vitals:   03/20/15 1023 03/20/15 1030  BP: 111/57   Pulse: 69 56  Temp:    Resp: 15 10    Last Pain:  Filed Vitals:   03/20/15 1031  PainSc: 0-No pain                 Phillips Grout

## 2015-03-20 NOTE — Anesthesia Preprocedure Evaluation (Addendum)
Anesthesia Evaluation  Patient identified by MRN, date of birth, ID band Patient awake    Reviewed: Allergy & Precautions, NPO status , Patient's Chart, lab work & pertinent test results  History of Anesthesia Complications (+) PONV  Airway Mallampati: II  TM Distance: >3 FB Neck ROM: Full    Dental no notable dental hx.    Pulmonary neg pulmonary ROS,    Pulmonary exam normal breath sounds clear to auscultation       Cardiovascular hypertension, negative cardio ROS Normal cardiovascular exam Rhythm:Regular Rate:Normal     Neuro/Psych negative neurological ROS  negative psych ROS   GI/Hepatic negative GI ROS, Neg liver ROS,   Endo/Other  negative endocrine ROS  Renal/GU negative Renal ROS  negative genitourinary   Musculoskeletal negative musculoskeletal ROS (+)   Abdominal   Peds negative pediatric ROS (+)  Hematology negative hematology ROS (+)   Anesthesia Other Findings   Reproductive/Obstetrics negative OB ROS                             Anesthesia Physical  Anesthesia Plan  ASA: II  Anesthesia Plan: Spinal   Post-op Pain Management:    Induction: Intravenous  Airway Management Planned: Simple Face Mask  Additional Equipment:   Intra-op Plan:   Post-operative Plan: Extubation in OR  Informed Consent: I have reviewed the patients History and Physical, chart, labs and discussed the procedure including the risks, benefits and alternatives for the proposed anesthesia with the patient or authorized representative who has indicated his/her understanding and acceptance.   Dental advisory given  Plan Discussed with: CRNA  Anesthesia Plan Comments:         Anesthesia Quick Evaluation  

## 2015-03-20 NOTE — Interval H&P Note (Signed)
History and Physical Interval Note:  03/20/2015 7:56 AM  Darlene Johnson  has presented today for surgery, with the diagnosis of OA RIGHT HIP  The various methods of treatment have been discussed with the patient and family. After consideration of risks, benefits and other options for treatment, the patient has consented to  Procedure(s): RIGHT TOTAL HIP ARTHROPLASTY ANTERIOR APPROACH (Right) as a surgical intervention .  The patient's history has been reviewed, patient examined, no change in status, stable for surgery.  I have reviewed the patient's chart and labs.  Questions were answered to the patient's satisfaction.     Loanne Drilling

## 2015-03-20 NOTE — Transfer of Care (Signed)
Immediate Anesthesia Transfer of Care Note  Patient: Darlene Johnson  Procedure(s) Performed: Procedure(s): RIGHT TOTAL HIP ARTHROPLASTY ANTERIOR APPROACH (Right)  Patient Location: PACU  Anesthesia Type:Spinal  Level of Consciousness:  awake, patient cooperative and responds to stimulation  Airway & Oxygen Therapy:Patient Spontanous Breathing and Patient connected to face mask oxgen  Post-op Assessment:  Report given to PACU RN and Post -op Vital signs reviewed and stable  Post vital signs:  Reviewed and stable  Last Vitals:  Filed Vitals:   03/20/15 0638  BP: 138/69  Pulse: 66  Temp: 36.8 C  Resp: 16    Complications: No apparent anesthesia complications

## 2015-03-20 NOTE — Evaluation (Signed)
Physical Therapy Evaluation Patient Details Name: Darlene Johnson MRN: [id number] DOB: 10-06-1940 Today's Date: 03/20/2015   History of Present Illness  Pt is a 75 year old female s/p R DA THA  Clinical Impression  Pt is s/p R THA resulting in the deficits listed below (see PT Problem List).  Pt will benefit from skilled PT to increase their independence and safety with mobility to allow discharge to the venue listed below.  Pt tolerated short distance ambulation well POD #0 and plans to have daughter assist upon d/c.  Pt reports performing exercises prior to surgery as well.     Follow Up Recommendations Outpatient PT (HHPT if no transportation)    Equipment Recommendations  Rolling walker with 5" wheels    Recommendations for Other Services       Precautions / Restrictions Precautions Precautions: None Restrictions Other Position/Activity Restrictions: WBAT      Mobility  Bed Mobility Overal bed mobility: Needs Assistance Bed Mobility: Supine to Sit     Supine to sit: Min assist     General bed mobility comments: slight assist for R LE support  Transfers Overall transfer level: Needs assistance Equipment used: Rolling walker (2 wheeled) Transfers: Sit to/from Stand Sit to Stand: Min guard         General transfer comment: verbal cues for hand placement  Ambulation/Gait Ambulation/Gait assistance: Min guard Ambulation Distance (Feet): 40 Feet Assistive device: Rolling walker (2 wheeled) Gait Pattern/deviations: Step-through pattern;Decreased stride length;Decreased stance time - right     General Gait Details: verbal cues for sequence, RW positioning, pt reports slight dizziness but did not become worse  Stairs            Wheelchair Mobility    Modified Rankin (Stroke Patients Only)       Balance                                             Pertinent Vitals/Pain Pain Assessment: 0-10 Pain Score: 2  Pain Location: R  hip Pain Descriptors / Indicators: Aching Pain Intervention(s): Limited activity within patient's tolerance;Monitored during session;Repositioned;Ice applied    Home Living Family/patient expects to be discharged to:: Private residence Living Arrangements: Alone Available Help at Discharge: Family;Available 24 hours/day (daughter) Type of Home: House Home Access: Level entry     Home Layout: One level Home Equipment: Walker - 4 wheels      Prior Function Level of Independence: Independent               Hand Dominance        Extremity/Trunk Assessment               Lower Extremity Assessment: RLE deficits/detail RLE Deficits / Details: functional hip weakness observed       Communication   Communication: No difficulties  Cognition Arousal/Alertness: Awake/alert Behavior During Therapy: WFL for tasks assessed/performed Overall Cognitive Status: Within Functional Limits for tasks assessed                      General Comments      Exercises        Assessment/Plan    PT Assessment Patient needs continued PT services  PT Diagnosis Difficulty walking;Acute pain   PT Problem List Decreased strength;Decreased mobility;Decreased knowledge of use of DME;Pain  PT Treatment Interventions Functional mobility training;Stair training;Gait training;DME instruction;Patient/family  education;Therapeutic activities;Therapeutic exercise   PT Goals (Current goals can be found in the Care Plan section) Acute Rehab PT Goals PT Goal Formulation: With patient Time For Goal Achievement: 03/23/15 Potential to Achieve Goals: Good    Frequency 7X/week   Barriers to discharge        Co-evaluation               End of Session   Activity Tolerance: Patient tolerated treatment well Patient left: in chair;with family/visitor present;with call bell/phone within reach           Time: 1524-1536 PT Time Calculation (min) (ACUTE ONLY): 12  min   Charges:   PT Evaluation $PT Eval Low Complexity: 1 Procedure     PT G Codes:        Tanette Chauca,KATHrine E 03/20/2015, 3:55 PM Zenovia Jarred, PT, DPT 03/20/2015 Pager: [phone]

## 2015-03-20 NOTE — H&P (View-Only) (Signed)
Darlene Johnson DOB: 1940/07/03 Divorced / Language: English / Race: White Female Date of Admission:  03/20/2015 CC:  Right Hip Pain History of Present Illness  The patient is a 76 year old female who comes in for a preoperative History and Physical. The patient is scheduled for a right total hip arthroplasty (anterior) to be performed by Dr. Gus Rankin. Aluisio, MD at [facility] on 03-20-2015. The patient is a 75 year old female who presents with a hip problem. The patient reports right hip problems including pain and stiffness symptoms that have been present for year(s). The symptoms began without any known injury. Symptoms reported include hip pain, difficulty flexing hip, difficulty rotating hip and difficulty ambulating (especially with steps) The patient reports symptoms radiating to the: right thigh anteriorly and lower back and right flank area.The patient feels as if their symptoms are does feel they are worsening. Current treatment includes nonsteroidal anti-inflammatory drugs (Advil, prn). Prior to being seen today the patient was previously evaluated by a colleague Fish farm manager) 3 year(s) ago. Previous workup for this problem has included hip x-rays. Previous treatment for this problem has included physical therapy (she states she has continued with HEP). Unfortunately, right hip hs gotten progressively worse. It is at a point where it is bothering her at all times. She is having a much more difficult time getting around and doing things she desires. She is even having more difficulty with activities of daily living. Left hip bothers her also, but to much lesser degree. She has been told in the past that she needed hip replacement. She has been seen and felt to benefit from surgery. She would like to proceed with surgery at this time. They have been treated conservatively in the past for the above stated problem and despite conservative measures, they continue to have progressive pain  and severe functional limitations and dysfunction. They have failed non-operative management including home exercise, medications. It is felt that they would benefit from undergoing total joint replacement. Risks and benefits of the procedure have been discussed with the patient and they elect to proceed with surgery. There are no active contraindications to surgery such as ongoing infection or rapidly progressive neurological disease.  Problem List/Past Medical Primary osteoarthritis of right hip (M16.11)  Cardiac Arrhythmia  Palpitations  High blood pressure  Asthma  Childhood Hemorrhoids - Internal Menopause  Measles  Left Leg Fracture  Childhood  Allergies No Known Drug Allergies  Family History  Cerebrovascular Accident  Maternal Grandmother, Paternal Grandfather. Heart Disease  Paternal Grandmother. Hypertension  Father, Paternal Emelia Loron, Paternal Grandmother. Osteoarthritis  Father, Paternal Grandmother.  Social History  Children  1 Current drinker  11/01/2014: Currently drinks wine only occasionally per week Current work status  retired Scientist, physiological daily; does other Living situation  live alone Marital status  divorced No history of drug/alcohol rehab  Not under pain contract  Number of flights of stairs before winded  2-3 Tobacco / smoke exposure  11/01/2014: no Tobacco use  Never smoker. 11/01/2014 Post-Surgical Plans Home Advance Directives  Living Will  Medication History Hydrochlorothiazide (  Tablet, Oral) Active. Klor-Con M20 ( Tablet ER, Oral) Active. Advil (  Tablet, Oral) Active. Stool Softener (Oral) Specific strength unknown - Active. Cetirizine HCl (  Tablet, Oral) Active. Glucosamine (Oral) Specific strength unknown - Active. Flax Seed Oil (  Capsule, Oral) Active. Calcium+D3 Gradual Release (Oral) Specific strength unknown - Active. Vitamin D3 (1000UNIT Tablet, Oral)  Active. Turmeric (Oral) Specific strength unknown - Active. Metamucil (  Oral) Specific strength unknown - Active. Vitamin B12 TR ( Tablet ER, Oral) Active.  Past Surgical Histor Breast Biopsy  Date: 62. left Sinus Surgery  Date: 2013. Septoplasty   Review of Systems General Not Present- Chills, Fatigue, Fever, Memory Loss, Night Sweats, Weight Gain and Weight Loss. Skin Not Present- Eczema, Hives, Itching, Lesions and Rash. HEENT Not Present- Dentures, Double Vision, Headache, Hearing Loss, Tinnitus and Visual Loss. Respiratory Present- Allergies. Not Present- Chronic Cough, Coughing up blood, Shortness of breath at rest and Shortness of breath with exertion. Cardiovascular Not Present- Chest Pain, Difficulty Breathing Lying Down, Murmur, Palpitations, Racing/skipping heartbeats and Swelling. Gastrointestinal Not Present- Abdominal Pain, Bloody Stool, Constipation, Diarrhea, Difficulty Swallowing, Heartburn, Jaundice, Loss of appetitie, Nausea and Vomiting. Female Genitourinary Present- Urinating at Night. Not Present- Blood in Urine, Discharge, Flank Pain, Incontinence, Painful Urination, Urgency, Urinary frequency, Urinary Retention and Weak urinary stream. Musculoskeletal Present- Back Pain and Joint Pain. Not Present- Joint Swelling, Morning Stiffness, Muscle Pain, Muscle Weakness and Spasms. Neurological Not Present- Blackout spells, Difficulty with balance, Dizziness, Paralysis, Tremor and Weakness. Psychiatric Not Present- Insomnia.  Vitals Weight: 165 lb Height: 62.5in Weight was reported by patient. Height was reported by patient. Body Surface Area: 1.77 m Body Mass Index: 29.7 kg/m  BP: 128/68 (Sitting, Right Arm, Standard)   Physical Exam General Mental Status -Alert, cooperative and good historian. General Appearance-pleasant, Not in acute distress. Orientation-Oriented X3. Build & Nutrition-Well nourished and Well developed.  Head and  Neck Head-normocephalic, atraumatic . Neck Global Assessment - supple, no bruit auscultated on the right, no bruit auscultated on the left.  Eye Pupil - Bilateral-Regular and Round. Motion - Bilateral-EOMI.  Chest and Lung Exam Auscultation Breath sounds - clear at anterior chest wall and clear at posterior chest wall. Adventitious sounds - No Adventitious sounds.  Cardiovascular Auscultation Rhythm - Regular rate and rhythm. Heart Sounds - S1 WNL and S2 WNL. Murmurs & Other Heart Sounds - Auscultation of the heart reveals - No Murmurs.  Abdomen Palpation/Percussion Tenderness - Abdomen is non-tender to palpation. Rigidity (guarding) - Abdomen is soft. Auscultation Auscultation of the abdomen reveals - Bowel sounds normal.  Female Genitourinary Note: Not done, not pertinent to present illness   Musculoskeletal Note: On exam, a well-developed female, alert and oriented, in no apparent distress. Evaluation of her left hip flexion about 110, rotation in 10, out 30, abduction 30 with discomfort on range of motion. Right hip flexion is about 90. No internal or external rotation and about 5 degrees of abduction. Her knee exams are normal. Pulses, sensation and motor are intact. Gait pattern is significantly antalgic.  RADIOGRAPHS AP pelvis and lateral of the right hip show advanced end-stage arthritis of that right hip, bone-on-bone with subchondral cystic formation and large osteophyte formation. She also has degenerative change on the left, but to a lesser degree.  Assessment & Plan  Primary osteoarthritis of right hip (M16.11)  Note:Surgical Plans: Right Total Hip Replacement - Anterior Approach  Disposition: Home - Patient lives in [place], [place].  PCP: Dr. Loni Dolly - Patient has been seen preoperatively and felt to be stable for surgery. Cards: Dr. Hyacinth Meeker - Patient has been seen preoperatively and felt to be stable for surgery. "she is low risk for a cardiac event  int he next year and can go ahead and have her hip surgery as indicated."  IV TXA  Anesthesia Issues: Nausea and vomiting following anesthesia in the past.  Signed electronically by Lauraine Rinne, III PA-C

## 2015-03-20 NOTE — Progress Notes (Signed)
6th floor notified pt will be there in 20 minutes needs pump and pulsox.

## 2015-03-20 NOTE — Progress Notes (Signed)
Utilization review completed.  

## 2015-03-20 NOTE — Anesthesia Procedure Notes (Signed)
Spinal Patient location during procedure: OR Start time: 03/20/2015 8:28 AM End time: 03/20/2015 8:30 AM Reason for block: at surgeon's request Staffing Resident/CRNA: Freddie Breech Performed by: resident/CRNA  Preanesthetic Checklist Completed: patient identified, site marked, surgical consent, pre-op evaluation, timeout performed, IV checked, risks and benefits discussed, monitors and equipment checked and at surgeon's request Spinal Block Patient position: sitting Prep: ChloraPrep Patient monitoring: heart rate, continuous pulse ox and blood pressure Approach: midline Location: L3-4 Injection technique: single-shot Needle Needle type: Sprotte  Needle gauge: 24 G Needle length: 10 cm Assessment Sensory level: T6 Additional Notes Anesthesiologist at bedside.  Expiration date of kit checked and confirmed. Patient tolerated procedure well, without complications. X 1 attempt with noted clear CSF return. Loss of motor and sensory on exam post injection.

## 2015-03-21 LAB — CBC
HCT: 33.8 % — ABNORMAL LOW (ref 36.0–46.0)
HEMOGLOBIN: 11.2 g/dL — AB (ref 12.0–15.0)
MCH: 29.2 pg (ref 26.0–34.0)
MCHC: 33.1 g/dL (ref 30.0–36.0)
MCV: 88.3 fL (ref 78.0–100.0)
Platelets: 129 10*3/uL — ABNORMAL LOW (ref 150–400)
RBC: 3.83 MIL/uL — AB (ref 3.87–5.11)
RDW: 13.7 % (ref 11.5–15.5)
WBC: 9.7 10*3/uL (ref 4.0–10.5)

## 2015-03-21 LAB — BASIC METABOLIC PANEL
ANION GAP: 8 (ref 5–15)
BUN: 13 mg/dL (ref 6–20)
CHLORIDE: 105 mmol/L (ref 101–111)
CO2: 24 mmol/L (ref 22–32)
Calcium: 8.4 mg/dL — ABNORMAL LOW (ref 8.9–10.3)
Creatinine, Ser: 0.63 mg/dL (ref 0.44–1.00)
GFR calc Af Amer: >60 mL/min (ref >60)
GFR calc non Af Amer: >60 mL/min (ref >60)
GLUCOSE: 122 mg/dL — AB (ref 65–99)
POTASSIUM: 3.2 mmol/L — AB (ref 3.5–5.1)
Sodium: 137 mmol/L (ref 135–145)

## 2015-03-21 MED ORDER — METHOCARBAMOL 500 MG PO TABS: 500.0000 mg | ORAL_TABLET | Freq: Four times a day (QID) | ORAL | Status: DC | PRN

## 2015-03-21 MED ORDER — RIVAROXABAN 10 MG PO TABS: 10.0000 mg | ORAL_TABLET | Freq: Every day | ORAL | Status: DC

## 2015-03-21 MED ORDER — TRAMADOL HCL 50 MG PO TABS: 50.0000 mg | ORAL_TABLET | Freq: Four times a day (QID) | ORAL | Status: DC | PRN

## 2015-03-21 MED ORDER — OXYCODONE HCL 5 MG PO TABS: 5.0000 mg | ORAL_TABLET | ORAL | Status: DC | PRN

## 2015-03-21 NOTE — Progress Notes (Signed)
[facility]   [facility] is providing the following services: RW  If patient discharges after hours, please call [phone].   Darlene Johnson 03/21/2015, 12:50 PM

## 2015-03-21 NOTE — Evaluation (Signed)
  Occupational Therapy Evaluation Patient Details Name: Darlene Johnson MRN: [id number] DOB: May 10, 1940 Today's Date: April 17, 2015    History of Present Illness Pt is a 75 year old female s/p R DA THA   Clinical Impression   OT education complete    Follow Up Recommendations  No OT follow up    Equipment Recommendations  None recommended by OT       Precautions / Restrictions Precautions Precautions: None Restrictions Other Position/Activity Restrictions: WBAT      Mobility Bed Mobility Overal bed mobility: Needs Assistance Bed Mobility: Supine to Sit     Supine to sit: Supervision     General bed mobility comments: verbal cues for self assist  Transfers Overall transfer level: Needs assistance Equipment used: Rolling walker (2 wheeled) Transfers: Sit to/from Stand Sit to Stand: Supervision         General transfer comment: verbal cues for hand placement    Balance                                            ADL Overall ADL's : Needs assistance/impaired                                       General ADL Comments: Pt overall S with ADL activity and min A with LB dressing. Daughter will A as needed               Pertinent Vitals/Pain Pain Assessment: 0-10 Pain Score: 2  Pain Location: r hip Pain Descriptors / Indicators: Sore Pain Intervention(s): Monitored during session     Hand Dominance     Extremity/Trunk Assessment Upper Extremity Assessment Upper Extremity Assessment: Overall WFL for tasks assessed           Communication Communication Communication: No difficulties   Cognition Arousal/Alertness: Awake/alert Behavior During Therapy: WFL for tasks assessed/performed Overall Cognitive Status: Within Functional Limits for tasks assessed                                Home Living Family/patient expects to be discharged to:: Private residence Living Arrangements: Alone Available Help at  Discharge: Family;Available 24 hours/day (daughter) Type of Home: House Home Access: Level entry     Home Layout: One level     Bathroom Shower/Tub: Producer, television/film/video: Standard     Home Equipment: Environmental consultant - 4 wheels          Prior Functioning/Environment Level of Independence: Independent                                       End of Session Equipment Utilized During Treatment: Engineer, water Communication: Mobility status  Activity Tolerance: Patient tolerated treatment well Patient left: in chair;with call bell/phone within reach;with family/visitor present   Time: 1319-1335 OT Time Calculation (min): 16 min Charges:  OT General Charges $OT Visit: 1 Procedure OT Evaluation $OT Eval Low Complexity: 1 Procedure G-Codes:    Darlene Johnson D Apr 17, 2015, 2:06 PM

## 2015-03-21 NOTE — Care Management Note (Signed)
Case Management Note  Patient Details  Name: Darlene Johnson MRN: [id number] Date of Birth: 08/13/1940  Subjective/Objective:                  Right total hip arthroplasty, anterior approach.  Action/Plan: Discharge planning Expected Discharge Date:  03/21/15               Expected Discharge Plan:  [place]  In-House Referral:     Discharge planning Services  CM Consult  Post Acute Care Choice:    Choice offered to:     DME Arranged:  Walker rolling DME Agency:  [place] Arranged:  PT [facility] Agency:  [place]  Status of Service:  Completed, signed off  Medicare Important Message Given:    Date Medicare IM Given:    Medicare IM give by:    Date Additional Medicare IM Given:    Additional Medicare Important Message give by:     If discussed at [place] of Stay Meetings, dates discussed:    Additional Comments: CM met with pt in room to offer choice of home health agency.  Pt chooses [facility] to render HHPT.  Referral given to [facility] rep, Tim (on unit). CM called [facility] DME rep, Lecretia to please deliver the rolling walker to room prior to discharge.  No other CM needs were communicated. [name], RN 03/21/2015, 4:22 PM

## 2015-03-21 NOTE — Progress Notes (Signed)
   Subjective: 1 Day Post-Op Procedure(s) (LRB): RIGHT TOTAL HIP ARTHROPLASTY ANTERIOR APPROACH (Right) Patient reports pain as mild.   Plan is to go Home after hospital stay.  Objective: Vital signs in last 24 hours: Temp:  [97.3 F (36.3 C)-97.8 F (36.6 C)] 97.6 F (36.4 C) (02/09 0549) Pulse Rate:  [55-70] 58 (02/09 0549) Resp:  [10-23] 18 (02/09 0549) BP: (92-124)/(38-88) 119/50 mmHg (02/09 0549) SpO2:  [96 %-100 %] 100 % (02/09 0549) Weight:  [75.297 kg (166 lb)] 75.297 kg (166 lb) (02/08 0752)  Intake/Output from previous day:  Intake/Output Summary (Last 24 hours) at 03/21/15 0702 Last data filed at 03/21/15 0600  Gross per 24 hour  Intake   4965 ml  Output   3085 ml  Net   1880 ml    Intake/Output this shift:    Labs:  Recent Labs  03/21/15 0414  HGB 11.2*    Recent Labs  03/21/15 0414  WBC 9.7  RBC 3.83*  HCT 33.8*  PLT 129*    Recent Labs  03/21/15 0414  NA 137  K 3.2*  CL 105  CO2 24  BUN 13  CREATININE 0.63  GLUCOSE 122*  CALCIUM 8.4*   No results for input(s): LABPT, INR in the last 72 hours.  EXAM General - Patient is Alert, Appropriate and Oriented Extremity - Neurologically intact Neurovascular intact No cellulitis present Compartment soft Dressing - dressing C/D/I Motor Function - intact, moving foot and toes well on exam.  Hemovac pulled without difficulty.  Past Medical History  Diagnosis Date  . Hypertension   . Fracture of patella, left, closed     age 75  . History of palpitations     related to stress- no recent problems  . Asthma     childhood  only -occ. allergies to environmental elemnts  . Hemorrhoids     "more internal"- no problems at problems  . Arthritis     osteoarthritis. -hips.  . Concussion     age 75 -hospital visit x 3 days."Hit head on cement"    Assessment/Plan: 1 Day Post-Op Procedure(s) (LRB): RIGHT TOTAL HIP ARTHROPLASTY ANTERIOR APPROACH (Right) Principal Problem:   OA  (osteoarthritis) of hip   Advance diet Up with therapy D/C IV fluids Discharge home with home health  DVT Prophylaxis - Xarelto Weight Bearing As Tolerated right Leg Hemovac Pulled  Darlene Johnson V

## 2015-03-21 NOTE — Progress Notes (Signed)
Physical Therapy Treatment Patient Details Name: Darlene Johnson MRN: [id number] DOB: [birth year] Today's Date: 2015-04-13    History of Present Illness Pt is a 75 year old female s/p R DA THA    PT Comments    Pt ambulating good distance and performed LE exercises.  Pt reviewed exercises she was performing prior to surgery and discussed not yet ready to return to those, so educated on new exercises and provided HEP handout.  Answered pt's questions and she feels ready for d/c home today.   Follow Up Recommendations  Outpatient PT     Equipment Recommendations  Rolling walker with 5" wheels    Recommendations for Other Services       Precautions / Restrictions Precautions Precautions: None Restrictions Other Position/Activity Restrictions: WBAT    Mobility  Bed Mobility Overal bed mobility: Needs Assistance Bed Mobility: Supine to Sit     Supine to sit: Supervision     General bed mobility comments: verbal cues for self assist  Transfers Overall transfer level: Needs assistance Equipment used: Rolling walker (2 wheeled) Transfers: Sit to/from Stand Sit to Stand: Min guard;Supervision         General transfer comment: verbal cues for hand placement  Ambulation/Gait Ambulation/Gait assistance: Min guard;Supervision Ambulation Distance (Feet): 200 Feet Assistive device: Rolling walker (2 wheeled) Gait Pattern/deviations: Step-through pattern;Decreased stride length     General Gait Details: verbal cues for RW positioning and step length   Stairs            Wheelchair Mobility    Modified Rankin (Stroke Patients Only)       Balance                                    Cognition Arousal/Alertness: Awake/alert Behavior During Therapy: WFL for tasks assessed/performed Overall Cognitive Status: Within Functional Limits for tasks assessed                      Exercises Total Joint Exercises Ankle Circles/Pumps: AROM;Both;10  reps Quad Sets: AROM;Both;10 reps Heel Slides: AAROM;Right;10 reps Hip ABduction/ADduction: AAROM;Right;10 reps Straight Leg Raises: AAROM;Right;10 reps    General Comments        Pertinent Vitals/Pain Pain Assessment: 0-10 Pain Score: 2  Pain Location: R hip Pain Descriptors / Indicators: Sore Pain Intervention(s): Limited activity within patient's tolerance;Monitored during session;Repositioned    Home Living                      Prior Function            PT Goals (current goals can now be found in the care plan section) Progress towards PT goals: Progressing toward goals    Frequency  7X/week    PT Plan Current plan remains appropriate    Co-evaluation             End of Session   Activity Tolerance: Patient tolerated treatment well Patient left: in bed;with call bell/phone within reach     Time: 4098-1191 PT Time Calculation (min) (ACUTE ONLY): 21 min  Charges:  $Therapeutic Exercise: 8-22 mins                    G Codes:      Kenadee Gates,KATHrine E 2015-04-13, 12:45 PM Zenovia Jarred, PT, DPT 2015-04-13 Pager: [phone]

## 2015-03-21 NOTE — Discharge Instructions (Signed)
Dr. Ollen Gross Total Joint Specialist [facility] [address], [address] [place], [place] [address] [phone]  ANTERIOR APPROACH TOTAL HIP REPLACEMENT POSTOPERATIVE DIRECTIONS   Hip Rehabilitation, Guidelines Following Surgery  The results of a hip operation are greatly improved after range of motion and muscle strengthening exercises. Follow all safety measures which are given to protect your hip. If any of these exercises cause increased pain or swelling in your joint, decrease the amount until you are comfortable again. Then slowly increase the exercises. Call your caregiver if you have problems or questions.   HOME CARE INSTRUCTIONS  Remove items at home which could result in a fall. This includes throw rugs or furniture in walking pathways.   ICE to the affected hip every three hours for 30 minutes at a time and then as needed for pain and swelling.  Continue to use ice on the hip for pain and swelling from surgery. You may notice swelling that will progress down to the foot and ankle.  This is normal after surgery.  Elevate the leg when you are not up walking on it.    Continue to use the breathing machine which will help keep your temperature down.  It is common for your temperature to cycle up and down following surgery, especially at night when you are not up moving around and exerting yourself.  The breathing machine keeps your lungs expanded and your temperature down.   DIET You may resume your previous home diet once your are discharged from the hospital.  DRESSING / WOUND CARE / SHOWERING You may start showering in 2 days but do not submerge the incision under water. Just pat the incision dry and apply a dry gauze dressing on daily. Change the surgical dressing daily and reapply a dry dressing each time.  ACTIVITY Walk with your walker as instructed. Use walker as long as suggested by your caregivers. Avoid periods of inactivity such as sitting  longer than an hour when not asleep. This helps prevent blood clots.  You may resume a sexual relationship in one month or when given the OK by your doctor.  You may return to work once you are cleared by your doctor.  Do not drive a car for 6 weeks or until released by you surgeon.  Do not drive while taking narcotics.  WEIGHT BEARING Weight bearing as tolerated with assist device (walker, cane, etc) as directed, use it as long as suggested by your surgeon or therapist, typically at least 4-6 weeks.  POSTOPERATIVE CONSTIPATION PROTOCOL Constipation - defined medically as fewer than three stools per week and severe constipation as less than one stool per week.  One of the most common issues patients have following surgery is constipation.  Even if you have a regular bowel pattern at home, your normal regimen is likely to be disrupted due to multiple reasons following surgery.  Combination of anesthesia, postoperative narcotics, change in appetite and fluid intake all can affect your bowels.  In order to avoid complications following surgery, here are some recommendations in order to help you during your recovery period.  Colace (docusate) - Pick up an over-the-counter form of Colace or another stool softener and take twice a day as long as you are requiring postoperative pain medications.  Take with a full glass of water daily.  If you experience loose stools or diarrhea, hold the colace until you stool forms back up.  If your symptoms do not get better within 1 week or  if they get worse, check with your doctor.  Dulcolax (bisacodyl) - Pick up over-the-counter and take as directed by the product packaging as needed to assist with the movement of your bowels.  Take with a full glass of water.  Use this product as needed if not relieved by Colace only.   MiraLax (polyethylene glycol) - Pick up over-the-counter to have on hand.  MiraLax is a solution that will increase the amount of water in your  bowels to assist with bowel movements.  Take as directed and can mix with a glass of water, juice, soda, coffee, or tea.  Take if you go more than two days without a movement. Do not use MiraLax more than once per day. Call your doctor if you are still constipated or irregular after using this medication for 7 days in a row.  If you continue to have problems with postoperative constipation, please contact the office for further assistance and recommendations.  If you experience "the worst abdominal pain ever" or develop nausea or vomiting, please contact the office immediatly for further recommendations for treatment.  ITCHING  If you experience itching with your medications, try taking only a single pain pill, or even half a pain pill at a time.  You can also use Benadryl over the counter for itching or also to help with sleep.   TED HOSE STOCKINGS Wear the elastic stockings on both legs for three weeks following surgery during the day but you may remove then at night for sleeping.  MEDICATIONS See your medication summary on the After Visit Summary that the nursing staff will review with you prior to discharge.  You may have some home medications which will be placed on hold until you complete the course of blood thinner medication.  It is important for you to complete the blood thinner medication as prescribed by your surgeon.  Continue your approved medications as instructed at time of discharge.  PRECAUTIONS If you experience chest pain or shortness of breath - call 911 immediately for transfer to the hospital emergency department.  If you develop a fever greater that 101 F, purulent drainage from wound, increased redness or drainage from wound, foul odor from the wound/dressing, or calf pain - CONTACT YOUR SURGEON.                                                   FOLLOW-UP APPOINTMENTS Make sure you keep all of your appointments after your operation with your surgeon and caregivers. You  should call the office at the above phone number and make an appointment for approximately two weeks after the date of your surgery or on the date instructed by your surgeon outlined in the "After Visit Summary".  RANGE OF MOTION AND STRENGTHENING EXERCISES  These exercises are designed to help you keep full movement of your hip joint. Follow your caregiver's or physical therapist's instructions. Perform all exercises about fifteen times, three times per day or as directed. Exercise both hips, even if you have had only one joint replacement. These exercises can be done on a training (exercise) mat, on the floor, on a table or on a bed. Use whatever works the best and is most comfortable for you. Use music or television while you are exercising so that the exercises are a pleasant break in your day. This will make  your life better with the exercises acting as a break in routine you can look forward to.  Lying on your back, slowly slide your foot toward your buttocks, raising your knee up off the floor. Then slowly slide your foot back down until your leg is straight again.  Lying on your back spread your legs as far apart as you can without causing discomfort.  Lying on your side, raise your upper leg and foot straight up from the floor as far as is comfortable. Slowly lower the leg and repeat.  Lying on your back, tighten up the muscle in the front of your thigh (quadriceps muscles). You can do this by keeping your leg straight and trying to raise your heel off the floor. This helps strengthen the largest muscle supporting your knee.  Lying on your back, tighten up the muscles of your buttocks both with the legs straight and with the knee bent at a comfortable angle while keeping your heel on the floor.   IF YOU ARE TRANSFERRED TO A SKILLED REHAB FACILITY If the patient is transferred to a skilled rehab facility following release from the hospital, a list of the current medications will be sent to the  facility for the patient to continue.  When discharged from the skilled rehab facility, please have the facility set up the patient's Home Health Physical Therapy prior to being released. Also, the skilled facility will be responsible for providing the patient with their medications at time of release from the facility to include their pain medication, the muscle relaxants, and their blood thinner medication. If the patient is still at the rehab facility at time of the two week follow up appointment, the skilled rehab facility will also need to assist the patient in arranging follow up appointment in our office and any transportation needs.  MAKE SURE YOU:  Understand these instructions.  Get help right away if you are not doing well or get worse.    Pick up stool softner and laxative for home use following surgery while on pain medications. Do not submerge incision under water. Please use good hand washing techniques while changing dressing each day. May shower starting three days after surgery. Please use a clean towel to pat the incision dry following showers. Continue to use ice for pain and swelling after surgery. Do not use any lotions or creams on the incision until instructed by your surgeon. Information on my medicine - XARELTO (Rivaroxaban)  This medication education was reviewed with me or my healthcare representative as part of my discharge preparation.  The pharmacist that spoke with me during my hospital stay was:  Maurice March [facility]  Why was Xarelto prescribed for you? Xarelto was prescribed for you to reduce the risk of blood clots forming after orthopedic surgery. The medical term for these abnormal blood clots is venous thromboembolism (VTE).  What do you need to know about xarelto ? Take your Xarelto ONCE DAILY at the same time every day. You may take it either with or without food.  If you have difficulty swallowing the tablet whole, you may crush it and mix in  applesauce just prior to taking your dose.  Take Xarelto exactly as prescribed by your doctor and DO NOT stop taking Xarelto without talking to the doctor who prescribed the medication.  Stopping without other VTE prevention medication to take the place of Xarelto may increase your risk of developing a clot.  After discharge, you should have regular check-up appointments with  your healthcare provider that is prescribing your Xarelto.    What do you do if you miss a dose? If you miss a dose, take it as soon as you remember on the same day then continue your regularly scheduled once daily regimen the next day. Do not take two doses of Xarelto on the same day.   Important Safety Information A possible side effect of Xarelto is bleeding. You should call your healthcare provider right away if you experience any of the following: ? Bleeding from an injury or your nose that does not stop. ? Unusual colored urine (red or dark brown) or unusual colored stools (red or black). ? Unusual bruising for unknown reasons. ? A serious fall or if you hit your head (even if there is no bleeding).  Some medicines may interact with Xarelto and might increase your risk of bleeding while on Xarelto. To help avoid this, consult your healthcare provider or pharmacist prior to using any new prescription or non-prescription medications, including herbals, vitamins, non-steroidal anti-inflammatory drugs (NSAIDs) and supplements.  This website has more information on Xarelto: VisitDestination.com.br.

## 2015-03-28 NOTE — Discharge Summary (Signed)
Physician Discharge Summary   Patient ID: SHADOW STIGGERS MRN: [id number] DOB/AGE: 05/06/1940 75 y.o.  Admit date: 03/20/2015 Discharge date: 03/21/2015  Primary Diagnosis:  Osteoarthritis of the Right hip.   Admission Diagnoses:  Past Medical History  Diagnosis Date  . Hypertension   . Fracture of patella, left, closed     age 72  . History of palpitations     related to stress- no recent problems  . Asthma     childhood  only -occ. allergies to environmental elemnts  . Hemorrhoids     "more internal"- no problems at problems  . Arthritis     osteoarthritis. -hips.  . Concussion     age 70 -hospital visit x 3 days."Hit head on cement"   Discharge Diagnoses:   Principal Problem:   OA (osteoarthritis) of hip  Estimated body mass index is 30.35 kg/(m^2) as calculated from the following:   Height as of this encounter: 5' 2"  (1.575 m).   Weight as of this encounter: 75.297 kg (166 lb).  Procedure(s) (LRB): RIGHT TOTAL HIP ARTHROPLASTY ANTERIOR APPROACH (Right)   Consults: None  HPI: Darlene Johnson is a 75 y.o. female who has advanced end-  stage arthritis of her Right hip with progressively worsening pain and  dysfunction.The patient has failed nonoperative management and presents for  total hip arthroplasty.   Laboratory Data: Admission on 03/20/2015, Discharged on 03/21/2015  Component Date Value Ref Range Status  . WBC 03/21/2015 9.7  4.0 - 10.5 K/uL Final  . RBC 03/21/2015 3.83* 3.87 - 5.11 MIL/uL Final  . Hemoglobin 03/21/2015 11.2* 12.0 - 15.0 g/dL Final  . HCT 03/21/2015 33.8* 36.0 - 46.0 % Final  . MCV 03/21/2015 88.3  78.0 - 100.0 fL Final  . MCH 03/21/2015 29.2  26.0 - 34.0 pg Final  . MCHC 03/21/2015 33.1  30.0 - 36.0 g/dL Final  . RDW 03/21/2015 13.7  11.5 - 15.5 % Final  . Platelets 03/21/2015 129* 150 - 400 K/uL Final  . Sodium 03/21/2015 137  135 - 145 mmol/L Final  . Potassium 03/21/2015 3.2* 3.5 - 5.1 mmol/L Final  . Chloride 03/21/2015 105  101 - 111  mmol/L Final  . CO2 03/21/2015 24  22 - 32 mmol/L Final  . Glucose, Bld 03/21/2015 122* 65 - 99 mg/dL Final  . BUN 03/21/2015 13  6 - 20 mg/dL Final  . Creatinine, Ser 03/21/2015 0.63  0.44 - 1.00 mg/dL Final  . Calcium 03/21/2015 8.4* 8.9 - 10.3 mg/dL Final  . GFR calc non Af Amer 03/21/2015 >60  >60 mL/min Final  . GFR calc Af Amer 03/21/2015 >60  >60 mL/min Final   Comment: (NOTE) The eGFR has been calculated using the CKD EPI equation. This calculation has not been validated in all clinical situations. eGFR's persistently <60 mL/min signify possible Chronic Kidney Disease.   [name] gap 03/21/2015 8  5 - 15 Final  Hospital Outpatient Visit on 03/13/2015  Component Date Value Ref Range Status  . MRSA, PCR 03/13/2015 NEGATIVE  NEGATIVE Final  . Staphylococcus aureus 03/13/2015 NEGATIVE  NEGATIVE Final   Comment:        The Xpert SA Assay (FDA approved for NASAL specimens in patients over 23 years of age), is one component of a comprehensive surveillance program.  Test performance has been validated by [facility] for patients greater than or equal to 41 year old. It is not intended to diagnose infection nor to guide or monitor treatment.   Marland Kitchen  aPTT 03/13/2015 29  24 - 37 seconds Final  . WBC 03/13/2015 7.1  4.0 - 10.5 K/uL Final  . RBC 03/13/2015 5.34* 3.87 - 5.11 MIL/uL Final  . Hemoglobin 03/13/2015 15.4* 12.0 - 15.0 g/dL Final  . HCT 03/13/2015 47.1* 36.0 - 46.0 % Final  . MCV 03/13/2015 88.2  78.0 - 100.0 fL Final  . MCH 03/13/2015 28.8  26.0 - 34.0 pg Final  . MCHC 03/13/2015 32.7  30.0 - 36.0 g/dL Final  . RDW 03/13/2015 13.6  11.5 - 15.5 % Final  . Platelets 03/13/2015 175  150 - 400 K/uL Final  . Sodium 03/13/2015 139  135 - 145 mmol/L Final  . Potassium 03/13/2015 4.3  3.5 - 5.1 mmol/L Final  . Chloride 03/13/2015 103  101 - 111 mmol/L Final  . CO2 03/13/2015 28  22 - 32 mmol/L Final  . Glucose, Bld 03/13/2015 101* 65 - 99 mg/dL Final  . BUN 03/13/2015 17  6 -  20 mg/dL Final  . Creatinine, Ser 03/13/2015 0.76  0.44 - 1.00 mg/dL Final  . Calcium 03/13/2015 9.2  8.9 - 10.3 mg/dL Final  . Total Protein 03/13/2015 6.9  6.5 - 8.1 g/dL Final  . Albumin 03/13/2015 4.3  3.5 - 5.0 g/dL Final  . AST 03/13/2015 24  15 - 41 U/L Final  . ALT 03/13/2015 17  14 - 54 U/L Final  . Alkaline Phosphatase 03/13/2015 85  38 - 126 U/L Final  . Total Bilirubin 03/13/2015 0.8  0.3 - 1.2 mg/dL Final  . GFR calc non Af Amer 03/13/2015 >60  >60 mL/min Final  . GFR calc Af Amer 03/13/2015 >60  >60 mL/min Final   Comment: (NOTE) The eGFR has been calculated using the CKD EPI equation. This calculation has not been validated in all clinical situations. eGFR's persistently <60 mL/min signify possible Chronic Kidney Disease.   . Anion gap 03/13/2015 8  5 - 15 Final  . Prothrombin Time 03/13/2015 14.2  11.6 - 15.2 seconds Final  . INR 03/13/2015 1.08  0.00 - 1.49 Final  . ABO/RH(D) 03/13/2015 O NEG   Final  . Antibody Screen 03/13/2015 NEG   Final  . Sample Expiration 03/13/2015 03/23/2015   Final  . Extend sample reason 03/13/2015 NO TRANSFUSIONS OR PREGNANCY IN THE PAST 3 MONTHS   Final  . Color, Urine 03/13/2015 YELLOW  YELLOW Final  . APPearance 03/13/2015 CLEAR  CLEAR Final  . Specific Gravity, Urine 03/13/2015 1.020  1.005 - 1.030 Final  . pH 03/13/2015 7.5  5.0 - 8.0 Final  . Glucose, UA 03/13/2015 NEGATIVE  NEGATIVE mg/dL Final  . Hgb urine dipstick 03/13/2015 NEGATIVE  NEGATIVE Final  . Bilirubin Urine 03/13/2015 NEGATIVE  NEGATIVE Final  . Ketones, ur 03/13/2015 NEGATIVE  NEGATIVE mg/dL Final  . Protein, ur 03/13/2015 NEGATIVE  NEGATIVE mg/dL Final  . Nitrite 03/13/2015 NEGATIVE  NEGATIVE Final  . Leukocytes, UA 03/13/2015 NEGATIVE  NEGATIVE Final   MICROSCOPIC NOT DONE ON URINES WITH NEGATIVE PROTEIN, BLOOD, LEUKOCYTES, NITRITE, OR GLUCOSE <1000 mg/dL.  . ABO/RH(D) 03/13/2015 O NEG   Final     X-Rays:Dg Pelvis Portable  03/20/2015  CLINICAL DATA:  Status  post right hip arthroplasty. EXAM: PORTABLE PELVIS 1-2 VIEWS COMPARISON:  None. FINDINGS: There has been a total right hip arthroplasty with satisfactory alignment of the acetabular and long stem femoral components. There is no evidence of fracture. Surgical drain and expected soft tissue emphysema are seen. Advanced osteoarthritic changes of the left  hip are noted. IMPRESSION: Post total right hip arthroplasty without evidence of immediate complications. Electronically Signed   By: Fidela Salisbury M.D.   On: 03/20/2015 10:37   Dg C-arm 1-60 Min-no Report  03/20/2015  CLINICAL DATA: surgery C-ARM 1-60 MINUTES Fluoroscopy was utilized by the requesting physician.  No radiographic interpretation.    EKG:No orders found for this or any previous visit.   Hospital Course: Patient was admitted to [facility] and taken to the OR and underwent the above state procedure without complications.  Patient tolerated the procedure well and was later transferred to the recovery room and then to the orthopaedic floor for postoperative care.  They were given PO and IV analgesics for pain control following their surgery.  They were given 24 hours of postoperative antibiotics of  Anti-infectives    Start     Dose/Rate Route Frequency Ordered Stop   03/20/15 1400  ceFAZolin (ANCEF) IVPB 2 g/50 mL premix     2 g 100 mL/hr over 30 Minutes Intravenous Every 6 hours 03/20/15 1119 03/20/15 2005   03/20/15 0721  ceFAZolin (ANCEF) IVPB 2 g/50 mL premix     2 g 100 mL/hr over 30 Minutes Intravenous On call to O.R. 03/20/15 [id number] 03/20/15 0835     and started on DVT prophylaxis in the form of Xarelto.   PT and OT were ordered for total hip protocol.  The patient was allowed to be WBAT with therapy. Discharge planning was consulted to help with postop disposition and equipment needs.  Patient had a good night on the evening of surgery.  They started to get up OOB with therapy on day one.  Hemovac drain was pulled  without difficulty.  Patient was seen in rounds on POD 1 by Dr. Wynelle Link and it was felt that the patient would be ready to go home.  Diet: Cardiac diet Activity:WBAT Follow-up:in 2 weeks Disposition - Home Discharged Condition: good      Medication List    STOP taking these medications        Flax Seed Oil 1000 MG Caps     ibuprofen 200 MG tablet  Commonly known as:  ADVIL,MOTRIN     TURMERIC PO     vitamin B-12 1000 MCG tablet  Commonly known as:  CYANOCOBALAMIN      TAKE these medications        CALCIUM 600 + D PO  Take 1 tablet by mouth daily.     cetirizine 10 MG tablet  Commonly known as:  ZYRTEC  Take 10 mg by mouth daily.     cholecalciferol 1000 units tablet  Commonly known as:  VITAMIN D  Take 1,000 Units by mouth daily.     docusate sodium 100 MG capsule  Commonly known as:  COLACE  Take 100 mg by mouth daily.     hydrochlorothiazide 25 MG tablet  Commonly known as:  HYDRODIURIL  Take 25 mg by mouth daily.     methocarbamol 500 MG tablet  Commonly known as:  ROBAXIN  Take 1 tablet (500 mg total) by mouth every 6 (six) hours as needed for muscle spasms.     oxyCODONE 5 MG immediate release tablet  Commonly known as:  Oxy IR/ROXICODONE  Take 1-2 tablets (5-10 mg total) by mouth every 3 (three) hours as needed for breakthrough pain.     potassium chloride SA 20 MEQ tablet  Commonly known as:  K-DUR,KLOR-CON  Take 20 mEq by mouth daily.  psyllium 0.52 g capsule  Commonly known as:  REGULOID  Take 1.04 g by mouth daily.     rivaroxaban 10 MG Tabs tablet  Commonly known as:  XARELTO  Take 1 tablet (10 mg total) by mouth daily with breakfast.     SYSTANE ULTRA 0.4-0.3 % Soln  Generic drug:  Polyethyl Glycol-Propyl Glycol  Apply 1 drop to eye 2 (two) times daily.     traMADol 50 MG tablet  Commonly known as:  ULTRAM  Take 1-2 tablets (50-100 mg total) by mouth every 6 (six) hours as needed for moderate pain.           Follow-up  Information    Follow up with Gearlean Alf, MD. Schedule an appointment as soon as possible for a visit on 04/02/2015.   Specialty:  Orthopedic Surgery   Why:  Call [phone] tomorrow to make the appointment   Contact information:   [address] [place] [address] [phone]       Signed: Arlee Muslim, PA-C Orthopaedic Surgery 03/28/2015, 8:59 AM

## 2015-07-21 ENCOUNTER — Ambulatory Visit: Payer: Self-pay | Admitting: Orthopedic Surgery

## 2015-08-27 ENCOUNTER — Encounter (HOSPITAL_COMMUNITY): Payer: Self-pay

## 2015-08-27 ENCOUNTER — Ambulatory Visit: Payer: Self-pay | Admitting: Orthopedic Surgery

## 2015-08-27 ENCOUNTER — Encounter (HOSPITAL_COMMUNITY)
Admission: RE | Admit: 2015-08-27 | Discharge: 2015-08-27 | Disposition: A | Discharge status: Home / Self Care | Payer: Medicare Other | Source: Ambulatory Visit | Attending: Orthopedic Surgery | Admitting: Orthopedic Surgery

## 2015-08-27 DIAGNOSIS — I10 Essential (primary) hypertension: Secondary | ICD-10-CM | POA: Insufficient documentation

## 2015-08-27 DIAGNOSIS — Z01812 Encounter for preprocedural laboratory examination: Secondary | ICD-10-CM | POA: Diagnosis not present

## 2015-08-27 DIAGNOSIS — R002 Palpitations: Secondary | ICD-10-CM | POA: Diagnosis not present

## 2015-08-27 DIAGNOSIS — M169 Osteoarthritis of hip, unspecified: Secondary | ICD-10-CM | POA: Diagnosis not present

## 2015-08-27 DIAGNOSIS — Z96641 Presence of right artificial hip joint: Secondary | ICD-10-CM | POA: Insufficient documentation

## 2015-08-27 LAB — APTT: aPTT: 28 seconds (ref 24–37)

## 2015-08-27 LAB — SURGICAL PCR SCREEN
MRSA, PCR: NEGATIVE
Staphylococcus aureus: NEGATIVE

## 2015-08-27 LAB — PROTIME-INR
INR: 1.07 (ref 0.00–1.49)
Prothrombin Time: 14.1 seconds (ref 11.6–15.2)

## 2015-08-27 LAB — COMPREHENSIVE METABOLIC PANEL

## 2015-08-27 NOTE — Progress Notes (Signed)
CMP done 08/20/15 on chart  U/A and culture- 08/20/2015 on chart Pt-08/22/15 on chart  CBC-08/20/15 on chart

## 2015-08-27 NOTE — Progress Notes (Addendum)
EKG- 10/10/14- in EPIC under media tab  ECHo-12/2014- EPIC under media tab Cardiology OV note- 12/2014- in EPIC under Media tab  Lexiscan 12/2014 under media tab in EPIC

## 2015-08-27 NOTE — H&P (Signed)
Darlene PihDawn H Johnson DOB: 1940/07/08 Divorced / Language: English / Race: White Female Date of Admission:  09/04/15 CC:  Left Hip Pain History of Present Illness The patient is a 75 year old female who comes in for a preoperative History and Physical. The patient is scheduled for a left total hip arthroplasty (anterior) to be performed by Dr. Gus RankinFrank V. Aluisio, MD [facility] on 09/[facility]. The patient is a 75 year old female with a hip problem. The patient reported bilateral hip problems including pain and stiffness symptoms that have been present for year(s). The right hip was the worst of the two but she has since had that one repalced. The left hip continues to hurt at this time. Symptoms reported include hip pain, difficulty flexing hip, difficulty rotating hip and difficulty ambulating (especially with steps).The patient feels as if their symptoms are does feel they are worsening. Current treatment includes nonsteroidal anti-inflammatory drugs (Advil, prn). Previous treatment for this problem has included physical therapy (she states she has continued with HEP). Unfortunately, the left hip has gotten progressively worse. It is at a point where it is bothering her at all times. She is doing a some better since the right hip replacement but the left hip continues to be an issue. She would like to proceed with surgery at this time. They have been treated conservatively in the past for the above stated problem and despite conservative measures, they continue to have progressive pain and severe functional limitations and dysfunction. They have failed non-operative management including home exercise, medications. It is felt that they would benefit from undergoing total joint replacement. Risks and benefits of the procedure have been discussed with the patient and they elect to proceed with surgery. There are no active contraindications to surgery such as ongoing infection or rapidly progressive  neurological disease.  Problem List/Past Medical Primary osteoarthritis of right hip (M16.11)  Status post right hip replacement (Z61.096(Z96.641)  Cardiac Arrhythmia  Palpitations  High blood pressure  Asthma  Childhood Hemorrhoids  Internal Menopause  Measles  Left Leg Fracture  Childhood  Allergies  No Known Drug Allergies   Family History Cerebrovascular Accident  Maternal Grandmother, Paternal Grandfather. Heart Disease  Paternal Grandmother. Hypertension  Father, Paternal Emelia LoronGrandfather, Paternal Grandmother. Osteoarthritis  Father, Paternal Grandmother.  Social History Children  1 Current drinker  11/01/2014: Currently drinks wine only occasionally per week Current work status  retired Scientist, physiologicalxercise  Exercises daily; does other Living situation  live alone Marital status  divorced No history of drug/alcohol rehab  Not under pain contract  Number of flights of stairs before winded  2-3 Tobacco / smoke exposure  11/01/2014: no Tobacco use  Never smoker. 11/01/2014 Post-Surgical Plans Home Advance Directives  Living Will  Medication History  Aspirin (81MG  Tablet Chewable, Oral) Active. Saline Specific strength unknown - Active. Cetirizine HCl (10MG  Tablet, Oral) Active. Flax Seed Oil (1000MG  Capsule, Oral) Active. Vitamin B12 TR (1000MCG Tablet ER, Oral) Active. Klor-Con M20 (20MEQ Tablet ER, Oral) Active. Hydrochlorothiazide (25MG  Tablet, Oral) Active. Vitamin D3 (1000UNIT Tablet, Oral) Active. Metamucil (2 daily Oral) Specific strength unknown - Active. Calcium+D3 Gradual Release (Oral) Specific strength unknown - Active. (600mg , 1 daily) Turmeric (500MG  Capsule, Oral) Active. Stool Softener (Oral) Specific strength unknown - Active. (2 daily Docusate Sodium 50mg , Sennosides 8.6mg )  Past Surgical History Breast Biopsy  Date: [id number]. left Total Hip Replacement - Right [03/20/2015]: Sinus Surgery  Date: 2013. Septoplasty Straighten  Nasal Septum   Review of Systems General Not Present-  Chills, Fatigue, Fever, Memory Loss, Night Sweats, Weight Gain and Weight Loss. Skin Not Present- Eczema, Hives, Itching, Lesions and Rash. HEENT Not Present- Dentures, Double Vision, Headache, Hearing Loss, Tinnitus and Visual Loss. Respiratory Not Present- Allergies, Chronic Cough, Coughing up blood, Shortness of breath at rest and Shortness of breath with exertion. Cardiovascular Not Present- Chest Pain, Difficulty Breathing Lying Down, Murmur, Palpitations, Racing/skipping heartbeats and Swelling. Gastrointestinal Not Present- Abdominal Pain, Bloody Stool, Constipation, Diarrhea, Difficulty Swallowing, Heartburn, Jaundice, Loss of appetitie, Nausea and Vomiting. Female Genitourinary Not Present- Blood in Urine, Discharge, Flank Pain, Incontinence, Painful Urination, Urgency, Urinary frequency, Urinary Retention, Urinating at Night and Weak urinary stream. Musculoskeletal Present- Back Pain and Joint Pain. Not Present- Joint Swelling, Morning Stiffness, Muscle Pain, Muscle Weakness and Spasms. Neurological Not Present- Blackout spells, Difficulty with balance, Dizziness, Paralysis, Tremor and Weakness. Psychiatric Not Present- Insomnia.  Vitals  Weight: 160 lb Height: 62in Weight was reported by patient. Height was reported by patient. Body Surface Area: 1.74 m Body Mass Index: 29.26 kg/m  Pulse: 64 (Regular)  BP: 122/62 (Sitting, Right Arm, Standard)   Physical Exam General Mental Status -Alert, cooperative and good historian. General Appearance-pleasant, Not in acute distress. Orientation-Oriented X3. Build & Nutrition-Well nourished and Well developed.  Head and Neck Head-normocephalic, atraumatic . Neck Global Assessment - supple, no bruit auscultated on the right, no bruit auscultated on the left.  Eye Vision-Wears corrective lenses(readers). Pupil - Bilateral-Regular and Round. Motion -  Bilateral-EOMI.  Chest and Lung Exam Auscultation Breath sounds - clear at anterior chest wall and clear at posterior chest wall. Adventitious sounds - No Adventitious sounds.  Cardiovascular Auscultation Rhythm - Regular rate and rhythm. Heart Sounds - S1 WNL and S2 WNL. Murmurs & Other Heart Sounds - Auscultation of the heart reveals - No Murmurs.  Abdomen Palpation/Percussion Tenderness - Abdomen is non-tender to palpation. Rigidity (guarding) - Abdomen is soft. Auscultation Auscultation of the abdomen reveals - Bowel sounds normal.  Female Genitourinary Note: Not done, not pertinent to present illness   Musculoskeletal Note: She is alert and oriented, no apparent distress. Her right hip can be flexed to 120, rotate in 30, out 40, abduct 40 without pain on range of motion of the right hip. Her left hip flexion to about 90, no internal rotation, about 10 degrees of external rotation, 10 degrees of abduction.  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 Celine Mans  IV TXA  Anesthesia Issues: None  Signed electronically by Beckey Rutter, III PA-C

## 2015-08-27 NOTE — Patient Instructions (Signed)
Darlene Johnson  08/27/2015   Your procedure is scheduled on: 09/04/2015    Report to [facility] Main  Entrance take [place]  elevators to 3rd floor to  Short Stay Center at   [phone] AM.  Call this number if you have problems the morning of surgery [phone]   Remember: ONLY 1 PERSON MAY GO WITH YOU TO SHORT STAY TO GET  READY MORNING OF YOUR SURGERY.  Do not eat food or drink liquids :After Midnight.     Take these medicines the morning of surgery with A SIP OF WATER: zyrtec if needed                                 You may not have any metal on your body including hair pins and              piercings  Do not wear jewelry, make-up, lotions, powders or perfumes, deodorant             Do not wear nail polish.  Do not shave  48 hours prior to surgery.     Do not bring valuables to the hospital. [facility] IS NOT             RESPONSIBLE   FOR VALUABLES.  Contacts, dentures or bridgework may not be worn into surgery.  Leave suitcase in the car. After surgery it may be brought to your room.         Special Instructions: coughing and deep breathing exercises, leg exercises               Please read over the following fact sheets you were given: _____________________________________________________________________             [facility] - Preparing for Surgery Before surgery, you can play an important role.  Because skin is not sterile, your skin needs to be as free of germs as possible.  You can reduce the number of germs on your skin by washing with CHG (chlorahexidine gluconate) soap before surgery.  CHG is an antiseptic cleaner which kills germs and bonds with the skin to continue killing germs even after washing. Please DO NOT use if you have an allergy to CHG or antibacterial soaps.  If your skin becomes reddened/irritated stop using the CHG and inform your nurse when you arrive at Short Stay. Do not shave (including legs and underarms) for at least 48 hours  prior to the first CHG shower.  You may shave your face/neck. Please follow these instructions carefully:  1.  Shower with CHG Soap the night before surgery and the  morning of Surgery.  2.  If you choose to wash your hair, wash your hair first as usual with your  normal  shampoo.  3.  After you shampoo, rinse your hair and body thoroughly to remove the  shampoo.                           4.  Use CHG as you would any other liquid soap.  You can apply chg directly  to the skin and wash                       Gently with a scrungie or clean washcloth.  5.  Apply the CHG Soap to your body ONLY FROM THE NECK DOWN.   Do not use on face/ open                           Wound or open sores. Avoid contact with eyes, ears mouth and genitals (private parts).                       Wash face,  Genitals (private parts) with your normal soap.             6.  Wash thoroughly, paying special attention to the area where your surgery  will be performed.  7.  Thoroughly rinse your body with warm water from the neck down.  8.  DO NOT shower/wash with your normal soap after using and rinsing off  the CHG Soap.                9.  Pat yourself dry with a clean towel.            10.  Wear clean pajamas.            11.  Place clean sheets on your bed the night of your first shower and do not  sleep with pets. Day of Surgery : Do not apply any lotions/deodorants the morning of surgery.  Please wear clean clothes to the hospital/surgery center.  FAILURE TO FOLLOW THESE INSTRUCTIONS MAY RESULT IN THE CANCELLATION OF YOUR SURGERY PATIENT SIGNATURE_________________________________  NURSE SIGNATURE__________________________________  ________________________________________________________________________  WHAT IS A BLOOD TRANSFUSION? Blood Transfusion Information  A transfusion is the replacement of blood or some of its parts. Blood is made up of multiple cells which provide different functions.  Red blood cells carry  oxygen and are used for blood loss replacement.  White blood cells fight against infection.  Platelets control bleeding.  Plasma helps clot blood.  Other blood products are available for specialized needs, such as hemophilia or other clotting disorders. BEFORE THE TRANSFUSION  Who gives blood for transfusions?   Healthy volunteers who are fully evaluated to make sure their blood is safe. This is blood bank blood. Transfusion therapy is the safest it has ever been in the practice of medicine. Before blood is taken from a donor, a complete history is taken to make sure that person has no history of diseases nor engages in risky social behavior (examples are intravenous drug use or sexual activity with multiple partners). The donor's travel history is screened to minimize risk of transmitting infections, such as malaria. The donated blood is tested for signs of infectious diseases, such as HIV and hepatitis. The blood is then tested to be sure it is compatible with you in order to minimize the chance of a transfusion reaction. If you or a relative donates blood, this is often done in anticipation of surgery and is not appropriate for emergency situations. It takes many days to process the donated blood. RISKS AND COMPLICATIONS Although transfusion therapy is very safe and saves many lives, the main dangers of transfusion include:  1. Getting an infectious disease. 2. Developing a transfusion reaction. This is an allergic reaction to something in the blood you were given. Every precaution is taken to prevent this. The decision to have a blood transfusion has been considered carefully by your caregiver before blood is given. Blood is not given unless the benefits outweigh the risks. AFTER THE TRANSFUSION  Right after receiving a  blood transfusion, you will usually feel much better and more energetic. This is especially true if your red blood cells have gotten low (anemic). The transfusion raises the  level of the red blood cells which carry oxygen, and this usually causes an energy increase.  The nurse administering the transfusion will monitor you carefully for complications. HOME CARE INSTRUCTIONS  No special instructions are needed after a transfusion. You may find your energy is better. Speak with your caregiver about any limitations on activity for underlying diseases you may have. SEEK MEDICAL CARE IF:   Your condition is not improving after your transfusion.  You develop redness or irritation at the intravenous (IV) site. SEEK IMMEDIATE MEDICAL CARE IF:  Any of the following symptoms occur over the next 12 hours:  Shaking chills.  You have a temperature by mouth above 102 F (38.9 C), not controlled by medicine.  Chest, back, or muscle pain.  People around you feel you are not acting correctly or are confused.  Shortness of breath or difficulty breathing.  Dizziness and fainting.  You get a rash or develop hives.  You have a decrease in urine output.  Your urine turns a dark color or changes to pink, red, or brown. Any of the following symptoms occur over the next 10 days:  You have a temperature by mouth above 102 F (38.9 C), not controlled by medicine.  Shortness of breath.  Weakness after normal activity.  The white part of the eye turns yellow (jaundice).  You have a decrease in the amount of urine or are urinating less often.  Your urine turns a dark color or changes to pink, red, or brown. Document Released: 01/24/2000 Document Revised: 04/20/2011 Document Reviewed: 09/12/2007 Exit[employer] Patient Information 2014 [employer] [place].  _______________________________________________________________________  Incentive Spirometer  An incentive spirometer is a tool that can help keep your lungs clear and active. This tool measures how well you are filling your lungs with each breath. Taking long deep breaths may help reverse or decrease the chance of  developing breathing (pulmonary) problems (especially infection) following:  A long period of time when you are unable to move or be active. BEFORE THE PROCEDURE   If the spirometer includes an indicator to show your best effort, your nurse or respiratory therapist will set it to a desired goal.  If possible, sit up straight or lean slightly forward. Try not to slouch.  Hold the incentive spirometer in an upright position. INSTRUCTIONS FOR USE  3. Sit on the edge of your bed if possible, or sit up as far as you can in bed or on a chair. 4. Hold the incentive spirometer in an upright position. 5. Breathe out normally. 6. Place the mouthpiece in your mouth and seal your lips tightly around it. 7. Breathe in slowly and as deeply as possible, raising the piston or the ball toward the top of the column. 8. Hold your breath for 3-5 seconds or for as long as possible. Allow the piston or ball to fall to the bottom of the column. 9. Remove the mouthpiece from your mouth and breathe out normally. 10. Rest for a few seconds and repeat Steps 1 through 7 at least 10 times every 1-2 hours when you are awake. Take your time and take a few normal breaths between deep breaths. 11. The spirometer may include an indicator to show your best effort. Use the indicator as a goal to work toward during each repetition. 12. After each set of 10  deep breaths, practice coughing to be sure your lungs are clear. If you have an incision (the cut made at the time of surgery), support your incision when coughing by placing a pillow or rolled up towels firmly against it. Once you are able to get out of bed, walk around indoors and cough well. You may stop using the incentive spirometer when instructed by your caregiver.  RISKS AND COMPLICATIONS  Take your time so you do not get dizzy or light-headed.  If you are in pain, you may need to take or ask for pain medication before doing incentive spirometry. It is harder to take  a deep breath if you are having pain. AFTER USE  Rest and breathe slowly and easily.  It can be helpful to keep track of a log of your progress. Your caregiver can provide you with a simple table to help with this. If you are using the spirometer at home, follow these instructions: SEEK MEDICAL CARE IF:   You are having difficultly using the spirometer.  You have trouble using the spirometer as often as instructed.  Your pain medication is not giving enough relief while using the spirometer.  You develop fever of 100.5 F (38.1 C) or higher. SEEK IMMEDIATE MEDICAL CARE IF:   You cough up bloody sputum that had not been present before.  You develop fever of 102 F (38.9 C) or greater.  You develop worsening pain at or near the incision site. MAKE SURE YOU:   Understand these instructions.  Will watch your condition.  Will get help right away if you are not doing well or get worse. Document Released: 06/08/2006 Document Revised: 04/20/2011 Document Reviewed: 08/09/2006 [facility] Patient Information 2014 [place], [place].   ________________________________________________________________________

## 2015-09-04 ENCOUNTER — Inpatient Hospital Stay (HOSPITAL_COMMUNITY): Payer: Medicare Other | Admitting: Anesthesiology

## 2015-09-04 ENCOUNTER — Inpatient Hospital Stay (HOSPITAL_COMMUNITY)
Admission: RE | Admit: 2015-09-04 | Discharge: 2015-09-05 | DRG: 470 | Disposition: A | Discharge status: Home Health | Payer: Medicare Other | Source: Ambulatory Visit | Attending: Orthopedic Surgery | Admitting: Orthopedic Surgery

## 2015-09-04 ENCOUNTER — Encounter (HOSPITAL_COMMUNITY)
Admission: RE | Disposition: A | Discharge status: Home Health | Payer: Self-pay | Source: Ambulatory Visit | Attending: Orthopedic Surgery

## 2015-09-04 ENCOUNTER — Inpatient Hospital Stay (HOSPITAL_COMMUNITY): Payer: Medicare Other

## 2015-09-04 ENCOUNTER — Encounter (HOSPITAL_COMMUNITY): Payer: Self-pay | Admitting: *Deleted

## 2015-09-04 DIAGNOSIS — M169 Osteoarthritis of hip, unspecified: Secondary | ICD-10-CM | POA: Diagnosis present

## 2015-09-04 DIAGNOSIS — J45909 Unspecified asthma, uncomplicated: Secondary | ICD-10-CM | POA: Diagnosis present

## 2015-09-04 DIAGNOSIS — Z79899 Other long term (current) drug therapy: Secondary | ICD-10-CM | POA: Diagnosis not present

## 2015-09-04 DIAGNOSIS — I10 Essential (primary) hypertension: Secondary | ICD-10-CM | POA: Diagnosis present

## 2015-09-04 DIAGNOSIS — Z96641 Presence of right artificial hip joint: Secondary | ICD-10-CM | POA: Diagnosis present

## 2015-09-04 DIAGNOSIS — M1612 Unilateral primary osteoarthritis, left hip: Principal | ICD-10-CM | POA: Diagnosis present

## 2015-09-04 DIAGNOSIS — M25552 Pain in left hip: Secondary | ICD-10-CM | POA: Diagnosis present

## 2015-09-04 DIAGNOSIS — Z96649 Presence of unspecified artificial hip joint: Secondary | ICD-10-CM

## 2015-09-04 DIAGNOSIS — Z7982 Long term (current) use of aspirin: Secondary | ICD-10-CM | POA: Diagnosis not present

## 2015-09-04 HISTORY — PX: TOTAL HIP ARTHROPLASTY: SHX124

## 2015-09-04 LAB — TYPE AND SCREEN
ABO/RH(D): O NEG
ANTIBODY SCREEN: NEGATIVE

## 2015-09-04 SURGERY — ARTHROPLASTY, HIP, TOTAL, ANTERIOR APPROACH
Anesthesia: Spinal | Site: Hip | Laterality: Left

## 2015-09-04 MED ORDER — METOCLOPRAMIDE HCL 5 MG/ML IJ SOLN: 5.0000 mg | Freq: Three times a day (TID) | INTRAMUSCULAR | Status: DC | PRN

## 2015-09-04 MED ORDER — METHOCARBAMOL 1000 MG/10ML IJ SOLN
500.0000 mg | Freq: Four times a day (QID) | INTRAVENOUS | Status: DC | PRN
  Administered 2015-09-04: 500 mg via INTRAVENOUS
  Filled 2015-09-04: qty 5
  Filled 2015-09-04: qty 550

## 2015-09-04 MED ORDER — TRANEXAMIC ACID 1000 MG/10ML IV SOLN
1000.0000 mg | INTRAVENOUS | Status: AC
  Administered 2015-09-04: 1000 mg via INTRAVENOUS
  Filled 2015-09-04: qty 10

## 2015-09-04 MED ORDER — TRAMADOL HCL 50 MG PO TABS: 50.0000 mg | ORAL_TABLET | Freq: Four times a day (QID) | ORAL | Status: DC | PRN

## 2015-09-04 MED ORDER — BUPIVACAINE HCL (PF) 0.25 % IJ SOLN
INTRAMUSCULAR | Status: DC | PRN
  Administered 2015-09-04: 30 mL

## 2015-09-04 MED ORDER — ONDANSETRON HCL 4 MG/2ML IJ SOLN: 4.0000 mg | Freq: Four times a day (QID) | INTRAMUSCULAR | Status: DC | PRN

## 2015-09-04 MED ORDER — ONDANSETRON HCL 4 MG/2ML IJ SOLN
INTRAMUSCULAR | Status: AC
  Filled 2015-09-04: qty 2

## 2015-09-04 MED ORDER — PROMETHAZINE HCL 25 MG/ML IJ SOLN: 6.2500 mg | INTRAMUSCULAR | Status: DC | PRN

## 2015-09-04 MED ORDER — ONDANSETRON HCL 4 MG/2ML IJ SOLN
INTRAMUSCULAR | Status: DC | PRN
  Administered 2015-09-04: 4 mg via INTRAVENOUS

## 2015-09-04 MED ORDER — METHOCARBAMOL 500 MG PO TABS
500.0000 mg | ORAL_TABLET | Freq: Four times a day (QID) | ORAL | Status: DC | PRN
  Administered 2015-09-05: 500 mg via ORAL
  Filled 2015-09-04: qty 1

## 2015-09-04 MED ORDER — POLYETHYLENE GLYCOL 3350 17 G PO PACK: 17.0000 g | PACK | Freq: Every day | ORAL | Status: DC | PRN

## 2015-09-04 MED ORDER — 0.9 % SODIUM CHLORIDE (POUR BTL) OPTIME
TOPICAL | Status: DC | PRN
  Administered 2015-09-04: 1000 mL

## 2015-09-04 MED ORDER — PSYLLIUM 0.52 G PO CAPS: 1.0400 g | ORAL_CAPSULE | Freq: Every day | ORAL | Status: DC

## 2015-09-04 MED ORDER — ONDANSETRON HCL 4 MG PO TABS: 4.0000 mg | ORAL_TABLET | Freq: Four times a day (QID) | ORAL | Status: DC | PRN

## 2015-09-04 MED ORDER — HYDROCHLOROTHIAZIDE 25 MG PO TABS
25.0000 mg | ORAL_TABLET | Freq: Every day | ORAL | Status: DC
  Administered 2015-09-05: 25 mg via ORAL
  Filled 2015-09-04: qty 1

## 2015-09-04 MED ORDER — ACETAMINOPHEN 325 MG PO TABS: 650.0000 mg | ORAL_TABLET | Freq: Four times a day (QID) | ORAL | Status: DC | PRN

## 2015-09-04 MED ORDER — PHENOL 1.4 % MT LIQD
1.0000 | OROMUCOSAL | Status: DC | PRN
  Filled 2015-09-04: qty 177

## 2015-09-04 MED ORDER — ACETAMINOPHEN 10 MG/ML IV SOLN
1000.0000 mg | Freq: Once | INTRAVENOUS | Status: AC
  Administered 2015-09-04: 1000 mg via INTRAVENOUS

## 2015-09-04 MED ORDER — PROPOFOL 500 MG/50ML IV EMUL
INTRAVENOUS | Status: DC | PRN
  Administered 2015-09-04: 30 mg via INTRAVENOUS

## 2015-09-04 MED ORDER — LORATADINE 10 MG PO TABS
10.0000 mg | ORAL_TABLET | Freq: Every day | ORAL | Status: DC
  Administered 2015-09-05: 10 mg via ORAL
  Filled 2015-09-04: qty 1

## 2015-09-04 MED ORDER — ACETAMINOPHEN 500 MG PO TABS
1000.0000 mg | ORAL_TABLET | Freq: Four times a day (QID) | ORAL | Status: AC
  Administered 2015-09-04 – 2015-09-05 (×4): 1000 mg via ORAL
  Filled 2015-09-04 (×4): qty 2

## 2015-09-04 MED ORDER — DEXAMETHASONE SODIUM PHOSPHATE 10 MG/ML IJ SOLN
INTRAMUSCULAR | Status: AC
  Filled 2015-09-04: qty 1

## 2015-09-04 MED ORDER — PROPOFOL 500 MG/50ML IV EMUL
INTRAVENOUS | Status: DC | PRN
  Administered 2015-09-04: 75 ug/kg/min via INTRAVENOUS

## 2015-09-04 MED ORDER — BISACODYL 10 MG RE SUPP: 10.0000 mg | Freq: Every day | RECTAL | Status: DC | PRN

## 2015-09-04 MED ORDER — PROPOFOL 10 MG/ML IV BOLUS
INTRAVENOUS | Status: AC
  Filled 2015-09-04: qty 20

## 2015-09-04 MED ORDER — MENTHOL 3 MG MT LOZG: 1.0000 | LOZENGE | OROMUCOSAL | Status: DC | PRN

## 2015-09-04 MED ORDER — OXYCODONE HCL 5 MG PO TABS
5.0000 mg | ORAL_TABLET | ORAL | Status: DC | PRN
  Administered 2015-09-04: 5 mg via ORAL
  Administered 2015-09-05 (×3): 10 mg via ORAL
  Filled 2015-09-04 (×3): qty 2
  Filled 2015-09-04: qty 1

## 2015-09-04 MED ORDER — FLEET ENEMA 7-19 GM/118ML RE ENEM: 1.0000 | ENEMA | Freq: Once | RECTAL | Status: DC | PRN

## 2015-09-04 MED ORDER — BUPIVACAINE HCL (PF) 0.25 % IJ SOLN
INTRAMUSCULAR | Status: AC
  Filled 2015-09-04: qty 30

## 2015-09-04 MED ORDER — DEXAMETHASONE SODIUM PHOSPHATE 10 MG/ML IJ SOLN
10.0000 mg | Freq: Once | INTRAMUSCULAR | Status: AC
Start: 2015-09-05 — End: 2015-09-05
  Administered 2015-09-05: 10 mg via INTRAVENOUS
  Filled 2015-09-04: qty 1

## 2015-09-04 MED ORDER — SODIUM CHLORIDE 0.9 % IV SOLN
INTRAVENOUS | Status: DC
  Administered 2015-09-04: 17:00:00 via INTRAVENOUS

## 2015-09-04 MED ORDER — LACTATED RINGERS IV SOLN
INTRAVENOUS | Status: DC
  Administered 2015-09-04 (×3): via INTRAVENOUS

## 2015-09-04 MED ORDER — BUPIVACAINE HCL (PF) 0.75 % IJ SOLN
INTRAMUSCULAR | Status: DC | PRN
  Administered 2015-09-04: 1.4 mL via INTRATHECAL

## 2015-09-04 MED ORDER — DEXAMETHASONE SODIUM PHOSPHATE 10 MG/ML IJ SOLN
10.0000 mg | Freq: Once | INTRAMUSCULAR | Status: AC
  Administered 2015-09-04: 10 mg via INTRAVENOUS

## 2015-09-04 MED ORDER — ACETAMINOPHEN 10 MG/ML IV SOLN
INTRAVENOUS | Status: AC
  Filled 2015-09-04: qty 100

## 2015-09-04 MED ORDER — POTASSIUM CHLORIDE CRYS ER 20 MEQ PO TBCR
20.0000 meq | EXTENDED_RELEASE_TABLET | Freq: Every day | ORAL | Status: DC
  Administered 2015-09-05: 20 meq via ORAL
  Filled 2015-09-04: qty 1

## 2015-09-04 MED ORDER — CEFAZOLIN SODIUM-DEXTROSE 2-4 GM/100ML-% IV SOLN
2.0000 g | Freq: Four times a day (QID) | INTRAVENOUS | Status: AC
  Administered 2015-09-04 – 2015-09-05 (×2): 2 g via INTRAVENOUS
  Filled 2015-09-04 (×2): qty 100

## 2015-09-04 MED ORDER — MORPHINE SULFATE (PF) 2 MG/ML IV SOLN
1.0000 mg | INTRAVENOUS | Status: DC | PRN
  Administered 2015-09-04: 1 mg via INTRAVENOUS
  Filled 2015-09-04: qty 1

## 2015-09-04 MED ORDER — METOCLOPRAMIDE HCL 5 MG PO TABS: 5.0000 mg | ORAL_TABLET | Freq: Three times a day (TID) | ORAL | Status: DC | PRN

## 2015-09-04 MED ORDER — TRANEXAMIC ACID 1000 MG/10ML IV SOLN
1000.0000 mg | Freq: Once | INTRAVENOUS | Status: AC
  Administered 2015-09-04: 1000 mg via INTRAVENOUS
  Filled 2015-09-04: qty 10

## 2015-09-04 MED ORDER — FENTANYL CITRATE (PF) 100 MCG/2ML IJ SOLN
INTRAMUSCULAR | Status: AC
  Filled 2015-09-04: qty 2

## 2015-09-04 MED ORDER — EPHEDRINE SULFATE 50 MG/ML IJ SOLN
INTRAMUSCULAR | Status: DC | PRN
  Administered 2015-09-04: 10 mg via INTRAVENOUS

## 2015-09-04 MED ORDER — PSYLLIUM 95 % PO PACK
1.0000 | PACK | Freq: Every day | ORAL | Status: DC
  Administered 2015-09-05: 1 via ORAL
  Filled 2015-09-04: qty 1

## 2015-09-04 MED ORDER — ACETAMINOPHEN 650 MG RE SUPP: 650.0000 mg | Freq: Four times a day (QID) | RECTAL | Status: DC | PRN

## 2015-09-04 MED ORDER — HYDROMORPHONE HCL 1 MG/ML IJ SOLN: 0.2500 mg | INTRAMUSCULAR | Status: DC | PRN

## 2015-09-04 MED ORDER — DOCUSATE SODIUM 100 MG PO CAPS
100.0000 mg | ORAL_CAPSULE | Freq: Two times a day (BID) | ORAL | Status: DC
  Administered 2015-09-04 – 2015-09-05 (×2): 100 mg via ORAL
  Filled 2015-09-04 (×2): qty 1

## 2015-09-04 MED ORDER — FENTANYL CITRATE (PF) 100 MCG/2ML IJ SOLN
INTRAMUSCULAR | Status: DC | PRN
  Administered 2015-09-04: 100 ug via INTRAVENOUS

## 2015-09-04 MED ORDER — DIPHENHYDRAMINE HCL 12.5 MG/5ML PO ELIX: 12.5000 mg | ORAL_SOLUTION | ORAL | Status: DC | PRN

## 2015-09-04 MED ORDER — CEFAZOLIN SODIUM-DEXTROSE 2-4 GM/100ML-% IV SOLN
INTRAVENOUS | Status: AC
  Filled 2015-09-04: qty 100

## 2015-09-04 MED ORDER — CEFAZOLIN SODIUM-DEXTROSE 2-4 GM/100ML-% IV SOLN
2.0000 g | INTRAVENOUS | Status: AC
  Administered 2015-09-04: 2 g via INTRAVENOUS

## 2015-09-04 MED ORDER — ASPIRIN EC 325 MG PO TBEC
325.0000 mg | DELAYED_RELEASE_TABLET | Freq: Two times a day (BID) | ORAL | Status: DC
  Administered 2015-09-05: 325 mg via ORAL
  Filled 2015-09-04: qty 1

## 2015-09-04 SURGICAL SUPPLY — 33 items
BAG DECANTER FOR FLEXI CONT (MISCELLANEOUS) ×2 IMPLANT
BAG ZIPLOCK 12X15 (MISCELLANEOUS) IMPLANT
BLADE SAG 18X100X1.27 (BLADE) ×2 IMPLANT
CAPT HIP TOTAL 2 ×2 IMPLANT
CLOTH BEACON ORANGE TIMEOUT ST (SAFETY) ×2 IMPLANT
COVER PERINEAL POST (MISCELLANEOUS) ×2 IMPLANT
DECANTER SPIKE VIAL GLASS SM (MISCELLANEOUS) ×2 IMPLANT
DRAPE STERI IOBAN 125X83 (DRAPES) ×2 IMPLANT
DRAPE U-SHAPE 47X51 STRL (DRAPES) ×4 IMPLANT
DRSG ADAPTIC 3X8 NADH LF (GAUZE/BANDAGES/DRESSINGS) ×2 IMPLANT
DRSG MEPILEX BORDER 4X4 (GAUZE/BANDAGES/DRESSINGS) ×2 IMPLANT
DRSG MEPILEX BORDER 4X8 (GAUZE/BANDAGES/DRESSINGS) ×2 IMPLANT
DURAPREP 26ML APPLICATOR (WOUND CARE) ×2 IMPLANT
ELECT REM PT RETURN 9FT ADLT (ELECTROSURGICAL) ×2
ELECTRODE REM PT RTRN 9FT ADLT (ELECTROSURGICAL) ×1 IMPLANT
EVACUATOR 1/8 PVC DRAIN (DRAIN) ×2 IMPLANT
GLOVE BIO SURGEON STRL SZ7.5 (GLOVE) ×2 IMPLANT
GLOVE BIO SURGEON STRL SZ8 (GLOVE) ×4 IMPLANT
GLOVE BIOGEL PI IND STRL 8 (GLOVE) ×2 IMPLANT
GLOVE BIOGEL PI INDICATOR 8 (GLOVE) ×2
GOWN STRL REUS W/TWL LRG LVL3 (GOWN DISPOSABLE) ×2 IMPLANT
GOWN STRL REUS W/TWL XL LVL3 (GOWN DISPOSABLE) ×2 IMPLANT
PACK ANTERIOR HIP CUSTOM (KITS) ×2 IMPLANT
STRIP CLOSURE SKIN 1/2X4 (GAUZE/BANDAGES/DRESSINGS) ×2 IMPLANT
SUT ETHIBOND NAB CT1 #1 30IN (SUTURE) ×2 IMPLANT
SUT MNCRL AB 4-0 PS2 18 (SUTURE) ×2 IMPLANT
SUT VIC AB 2-0 CT1 27 (SUTURE) ×3
SUT VIC AB 2-0 CT1 TAPERPNT 27 (SUTURE) ×3 IMPLANT
SUT VLOC 180 0 24IN GS25 (SUTURE) ×2 IMPLANT
SYR 50ML LL SCALE MARK (SYRINGE) IMPLANT
TRAY FOLEY W/METER SILVER 14FR (SET/KITS/TRAYS/PACK) ×2 IMPLANT
TRAY FOLEY W/METER SILVER 16FR (SET/KITS/TRAYS/PACK) IMPLANT
YANKAUER SUCT BULB TIP 10FT TU (MISCELLANEOUS) ×2 IMPLANT

## 2015-09-04 NOTE — H&P (View-Only) (Signed)
Darlene Johnson DOB: Aug 02, 1940 Divorced / Language: English / Race: White Female Date of Admission:  09/04/15 CC:  Left Hip Pain History of Present Illness The patient is a 75 year old female who comes in for a preoperative History and Physical. The patient is scheduled for a left total hip arthroplasty (anterior) to be performed by Dr. Gus Rankin. Aluisio, MD at [facility] on 09/04/2015. The patient is a 75 year old female with a hip problem. The patient reported bilateral hip problems including pain and stiffness symptoms that have been present for year(s). The right hip was the worst of the two but she has since had that one repalced. The left hip continues to hurt at this time. Symptoms reported include hip pain, difficulty flexing hip, difficulty rotating hip and difficulty ambulating (especially with steps).The patient feels as if their symptoms are does feel they are worsening. Current treatment includes nonsteroidal anti-inflammatory drugs (Advil, prn). Previous treatment for this problem has included physical therapy (she states she has continued with HEP). Unfortunately, the left hip has gotten progressively worse. It is at a point where it is bothering her at all times. She is doing a some better since the right hip replacement but the left hip continues to be an issue. She would like to proceed with surgery at this time. They have been treated conservatively in the past for the above stated problem and despite conservative measures, they continue to have progressive pain and severe functional limitations and dysfunction. They have failed non-operative management including home exercise, medications. It is felt that they would benefit from undergoing total joint replacement. Risks and benefits of the procedure have been discussed with the patient and they elect to proceed with surgery. There are no active contraindications to surgery such as ongoing infection or rapidly progressive  neurological disease.  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 Heart Disease  Paternal Grandmother. Hypertension  Father, Paternal Emelia Loron, Paternal Grandmother. Osteoarthritis  Father, Paternal Grandmother.  Social History Children  1 Current drinker  11/01/2014: Currently drinks wine only occasionally per week Current work status  retired Scientist, physiological daily; does other Living situation  live alone Marital status  divorced No history of drug/alcohol rehab  Not under pain contract  Number of flights of stairs before winded  2-3 Tobacco / smoke exposure  11/01/2014: no Tobacco use  Never smoker. 11/01/2014 Post-Surgical Plans Home Advance Directives  Living Will  Medication History  Aspirin (81MG  Tablet Chewable, Oral) Active. Saline Specific strength unknown - Active. Cetirizine HCl (10MG  Tablet, Oral) Active. Flax Seed Oil (1000MG  Capsule, Oral) Active. Vitamin B12 TR ( Tablet ER, Oral) Active. Klor-Con M20 ( Tablet ER, Oral) Active. Hydrochlorothiazide (25MG  Tablet, Oral) Active. Vitamin D3 (1000UNIT Tablet, Oral) Active. Metamucil (2 daily Oral) Specific strength unknown - Active. Calcium+D3 Gradual Release (Oral) Specific strength unknown - Active. (600mg , 1 daily) Turmeric (500MG  Capsule, Oral) Active. Stool Softener (Oral) Specific strength unknown - Active. (2 daily Docusate Sodium 50mg , Sennosides 8.6mg )  Past Surgical History Breast Biopsy  Date: 74. left Total Hip Replacement - Right [03/20/2015]: Sinus Surgery  Date: 2013. Septoplasty Straighten  Nasal Septum   Review of Systems General Not Present-  Chills, Fatigue, Fever, Memory Loss, Night Sweats, Weight Gain and Weight Loss. Skin Not Present- Eczema, Hives, Itching, Lesions and Rash. HEENT Not Present- Dentures, Double Vision, Headache, Hearing Loss, Tinnitus and Visual Loss. Respiratory Not Present- Allergies, Chronic Cough, Coughing up blood, Shortness of breath at rest and Shortness of breath with exertion. Cardiovascular Not Present- Chest Pain, Difficulty Breathing Lying Down, Murmur, Palpitations, Racing/skipping heartbeats and Swelling. Gastrointestinal Not Present- Abdominal Pain, Bloody Stool, Constipation, Diarrhea, Difficulty Swallowing, Heartburn, Jaundice, Loss of appetitie, Nausea and Vomiting. Female Genitourinary Not Present- Blood in Urine, Discharge, Flank Pain, Incontinence, Painful Urination, Urgency, Urinary frequency, Urinary Retention, Urinating at Night and Weak urinary stream. Musculoskeletal Present- Back Pain and Joint Pain. Not Present- Joint Swelling, Morning Stiffness, Muscle Pain, Muscle Weakness and Spasms. Neurological Not Present- Blackout spells, Difficulty with balance, Dizziness, Paralysis, Tremor and Weakness. Psychiatric Not Present- Insomnia.  Vitals  Weight: 160 lb Height: 62in Weight was reported by patient. Height was reported by patient. Body Surface Area: 1.74 m Body Mass Index: 29.26 kg/m  Pulse: 64 (Regular)  BP: 122/62 (Sitting, Right Arm, Standard)   Physical Exam General Mental Status -Alert, cooperative and good historian. General Appearance-pleasant, Not in acute distress. Orientation-Oriented X3. Build & Nutrition-Well nourished and Well developed.  Head and Neck Head-normocephalic, atraumatic . Neck Global Assessment - supple, no bruit auscultated on the right, no bruit auscultated on the left.  Eye Vision-Wears corrective lenses(readers). Pupil - Bilateral-Regular and Round. Motion -  Bilateral-EOMI.  Chest and Lung Exam Auscultation Breath sounds - clear at anterior chest wall and clear at posterior chest wall. Adventitious sounds - No Adventitious sounds.  Cardiovascular Auscultation Rhythm - Regular rate and rhythm. Heart Sounds - S1 WNL and S2 WNL. Murmurs & Other Heart Sounds - Auscultation of the heart reveals - No Murmurs.  Abdomen Palpation/Percussion Tenderness - Abdomen is non-tender to palpation. Rigidity (guarding) - Abdomen is soft. Auscultation Auscultation of the abdomen reveals - Bowel sounds normal.  Female Genitourinary Note: Not done, not pertinent to present illness   Musculoskeletal Note: She is alert and oriented, no apparent distress. Her right hip can be flexed to 120, rotate in 30, out 40, abduct 40 without pain on range of motion of the right hip. Her left hip flexion to about 90, no internal rotation, about 10 degrees of external rotation, 10 degrees of abduction.  Assessment & Plan Status post right hip replacement (Z61.096) Primary osteoarthritis of left hip (M16.12)  Note:Surgical Plans: Left Total Hip Replacement - Anterior Approach  Disposition: Home  PCP: Dr. Celine Mans  IV TXA  Anesthesia Issues: None  Signed electronically by Beckey Rutter, III PA-C

## 2015-09-04 NOTE — Transfer of Care (Signed)
Immediate Anesthesia Transfer of Care Note  Patient: Darlene Johnson  Procedure(s) Performed: Procedure(s): LEFT TOTAL HIP ARTHROPLASTY ANTERIOR APPROACH (Left)  Patient Location: PACU  Anesthesia Type:Spinal  Level of Consciousness:  sedated, patient cooperative and responds to stimulation  Airway & Oxygen Therapy:Patient Spontanous Breathing and Patient connected to face mask oxgen  Post-op Assessment:  Report given to PACU RN and Post -op Vital signs reviewed and stable  Post vital signs:  Reviewed and stable  Last Vitals:  Vitals:   09/04/15 0956  BP: 124/70  Pulse: (!) 58  Resp: 18  Temp: 36.6 C    Complications: No apparent anesthesia complicationsImmediate Anesthesia Transfer of Care Note  Patient: Darlene Johnson  Procedure(s) Performed: Procedure(s): LEFT TOTAL HIP ARTHROPLASTY ANTERIOR APPROACH (Left)  Patient Location: PACU  Anesthesia Type:Spinal  Level of Consciousness: awake, alert  and oriented  Airway & Oxygen Therapy: Patient Spontanous Breathing and Patient connected to face mask oxygen  Post-op Assessment: Report given to RN  Post vital signs: Reviewed and stable  Last Vitals:  Vitals:   09/04/15 0956  BP: 124/70  Pulse: (!) 58  Resp: 18  Temp: 36.6 C    Last Pain:  Vitals:   09/04/15 0956  TempSrc: Oral      Patients Stated Pain Goal: 4 (09/04/15 1022)  Complications: No apparent anesthesia complications

## 2015-09-04 NOTE — Anesthesia Procedure Notes (Signed)
Spinal  Patient location during procedure: OR Start time: 09/04/2015 1:10 PM End time: 09/04/2015 1:14 PM Staffing Resident/CRNA: Kizzie Fantasia Performed: resident/CRNA  Preanesthetic Checklist Completed: patient identified, site marked, surgical consent, pre-op evaluation, timeout performed, IV checked, risks and benefits discussed and monitors and equipment checked Spinal Block Patient position: sitting Prep: Betadine Patient monitoring: heart rate, continuous pulse ox and blood pressure Approach: midline Location: L3-4 Injection technique: single-shot Needle Needle type: Spinocan  Needle gauge: 22 G Needle length: 9 cm Needle insertion depth: 7 cm Additional Notes Pt sitting position, sterile prep and drape, positive csf aspiration, negative paresthesia, negative heme.

## 2015-09-04 NOTE — Anesthesia Postprocedure Evaluation (Signed)
Anesthesia Post Note  Patient: Darlene Johnson  Procedure(s) Performed: Procedure(s) (LRB): LEFT TOTAL HIP ARTHROPLASTY ANTERIOR APPROACH (Left)  Patient location during evaluation: PACU Anesthesia Type: Spinal and MAC Level of consciousness: awake and alert Pain management: pain level controlled Vital Signs Assessment: post-procedure vital signs reviewed and stable Respiratory status: spontaneous breathing and respiratory function stable Cardiovascular status: blood pressure returned to baseline and stable Postop Assessment: spinal receding Anesthetic complications: no    Last Vitals:  Vitals:   09/04/15 1530 09/04/15 1545  BP: (!) 96/58 (!) 123/55  Pulse: (!) 54 (!) 55  Resp: 14 17  Temp:      Last Pain:  Vitals:   09/04/15 1545  TempSrc:   PainSc: 3                  Shahmeer Bunn DANIEL

## 2015-09-04 NOTE — Op Note (Signed)
OPERATIVE REPORT  PREOPERATIVE DIAGNOSIS: Osteoarthritis of the Left hip.   POSTOPERATIVE DIAGNOSIS: Osteoarthritis of the Left  hip.   PROCEDURE: Left total hip arthroplasty, anterior approach.   SURGEON: Ollen Gross, MD   ASSISTANT: Sarita Bottom, PA-C  ANESTHESIA:  Spinal  ESTIMATED BLOOD LOSS:- 350 ml    DRAINS: Hemovac x1.   COMPLICATIONS: None   CONDITION: PACU - hemodynamically stable.   BRIEF CLINICAL NOTE: Darlene Johnson is a 75 y.o. female who has advanced end-  stage arthritis of their Left  hip with progressively worsening pain and  dysfunction.The patient has failed nonoperative management and presents for  total hip arthroplasty.   PROCEDURE IN DETAIL: After successful administration of spinal  anesthetic, the traction boots for the [facility] bed were placed on both  feet and the patient was placed onto the [facility] bed, boots placed into the leg  holders. The Left hip was then isolated from the perineum with plastic  drapes and prepped and draped in the usual sterile fashion. ASIS and  greater trochanter were marked and a oblique incision was made, starting  at about 1 cm lateral and 2 cm distal to the ASIS and coursing towards  the anterior cortex of the femur. The skin was cut with a 10 blade  through subcutaneous tissue to the level of the fascia overlying the  tensor fascia lata muscle. The fascia was then incised in line with the  incision at the junction of the anterior third and posterior 2/3rd. The  muscle was teased off the fascia and then the interval between the TFL  and the rectus was developed. The Hohmann retractor was then placed at  the top of the femoral neck over the capsule. The vessels overlying the  capsule were cauterized and the fat on top of the capsule was removed.  A Hohmann retractor was then placed anterior underneath the rectus  femoris to give exposure to the entire anterior capsule. A T-shaped  capsulotomy was performed. The  edges were tagged and the femoral head  was identified.       Osteophytes are removed off the superior acetabulum.  The femoral neck was then cut in situ with an oscillating saw. Traction  was then applied to the left lower extremity utilizing the [facility]  traction. The femoral head was then removed. Retractors were placed  around the acetabulum and then circumferential removal of the labrum was  performed. Osteophytes were also removed. Reaming starts at 45 mm to  medialize and  Increased in 2 mm increments to 47 mm. We reamed in  approximately 40 degrees of abduction, 20 degrees anteversion. A 48 mm  pinnacle acetabular shell was then impacted in anatomic position under  fluoroscopic guidance with excellent purchase. We did not need to place  any additional dome screws. A 28 mm neutral + 4 marathon liner was then  placed into the acetabular shell.       The femoral lift was then placed along the lateral aspect of the femur  just distal to the vastus ridge. The leg was  externally rotated and capsule  was stripped off the inferior aspect of the femoral neck down to the  level of the lesser trochanter, this was done with electrocautery. The femur was lifted after this was performed. The  leg was then placed in an extended and adducted position essentially delivering the femur. We also removed the capsule superiorly and the piriformis from the  piriformis fossa to gain excellent exposure of the  proximal femur. Rongeur was used to remove some cancellous bone to get  into the lateral portion of the proximal femur for placement of the  initial starter reamer. The starter broaches was placed  the starter broach  and was shown to go down the center of the canal. Broaching  with the  Corail system was then performed starting at size 8, coursing  Up to size 10. A size 10 had excellent torsional and rotational  and axial stability. The trial standard offset neck was then placed  with a 28 + 1.5 trial  head. The hip was then reduced. We confirmed that  the stem was in the canal both on AP and lateral x-rays. It also has excellent sizing. The hip was reduced with outstanding stability through full extension and full external rotation.. AP pelvis was taken and the leg lengths were measured and found to be equal. Hip was then dislocated again and the femoral head and neck removed. The  femoral broach was removed. Size 10 Corail stem with a standard offset  neck was then impacted into the femur following native anteversion. Has  excellent purchase in the canal. Excellent torsional and rotational and  axial stability. It is confirmed to be in the canal on AP and lateral  fluoroscopic views. The 28 + 1.5 ceramic head was placed and the hip  reduced with outstanding stability. Again AP pelvis was taken and it  confirmed that the leg lengths were equal. The wound was then copiously  irrigated with saline solution and the capsule reattached and repaired  with Ethibond suture. 30 ml of .25% Bupivicaine was  injected into the capsule and into the edge of the tensor fascia lata as well as subcutaneous tissue. The fascia overlying the tensor fascia lata was then closed with a running #1 V-Loc. Subcu was closed with interrupted 2-0 Vicryl and subcuticular running 4-0 Monocryl. Incision was cleaned  and dried. Steri-Strips and a bulky sterile dressing applied. Hemovac  drain was hooked to suction and then the patient was awakened and transported to  recovery in stable condition.        Please note that a surgical assistant was a medical necessity for this procedure to perform it in a safe and expeditious manner. Assistant was necessary to provide appropriate retraction of vital neurovascular structures and to prevent femoral fracture and allow for anatomic placement of the prosthesis.  Ollen Gross, M.D.

## 2015-09-04 NOTE — Interval H&P Note (Signed)
History and Physical Interval Note:  09/04/2015 12:18 PM  Darlene Johnson  has presented today for surgery, with the diagnosis of LEFT HIP OA  The various methods of treatment have been discussed with the patient and family. After consideration of risks, benefits and other options for treatment, the patient has consented to  Procedure(s): LEFT TOTAL HIP ARTHROPLASTY ANTERIOR APPROACH (Left) as a surgical intervention .  The patient's history has been reviewed, patient examined, no change in status, stable for surgery.  I have reviewed the patient's chart and labs.  Questions were answered to the patient's satisfaction.     Loanne Drilling

## 2015-09-04 NOTE — Anesthesia Preprocedure Evaluation (Signed)
Anesthesia Evaluation  Patient identified by MRN, date of birth, ID band Patient awake    Reviewed: Allergy & Precautions, NPO status , Patient's Chart, lab work & pertinent test results  History of Anesthesia Complications (+) PONV  Airway Mallampati: II  TM Distance: >3 FB Neck ROM: Full    Dental no notable dental hx.    Pulmonary neg pulmonary ROS,    Pulmonary exam normal breath sounds clear to auscultation       Cardiovascular hypertension, negative cardio ROS Normal cardiovascular exam Rhythm:Regular Rate:Normal     Neuro/Psych negative neurological ROS  negative psych ROS   GI/Hepatic negative GI ROS, Neg liver ROS,   Endo/Other  negative endocrine ROS  Renal/GU negative Renal ROS  negative genitourinary   Musculoskeletal negative musculoskeletal ROS (+)   Abdominal   Peds negative pediatric ROS (+)  Hematology negative hematology ROS (+)   Anesthesia Other Findings   Reproductive/Obstetrics negative OB ROS                             Anesthesia Physical  Anesthesia Plan  ASA: II  Anesthesia Plan: Spinal   Post-op Pain Management:    Induction: Intravenous  Airway Management Planned: Simple Face Mask  Additional Equipment:   Intra-op Plan:   Post-operative Plan: Extubation in OR  Informed Consent: I have reviewed the patients History and Physical, chart, labs and discussed the procedure including the risks, benefits and alternatives for the proposed anesthesia with the patient or authorized representative who has indicated his/her understanding and acceptance.   Dental advisory given  Plan Discussed with: CRNA  Anesthesia Plan Comments:         Anesthesia Quick Evaluation

## 2015-09-05 LAB — BASIC METABOLIC PANEL
Anion gap: 6 (ref 5–15)
BUN: 11 mg/dL (ref 6–20)
CALCIUM: 8.4 mg/dL — AB (ref 8.9–10.3)
CO2: 26 mmol/L (ref 22–32)
CREATININE: 0.63 mg/dL (ref 0.44–1.00)
Chloride: 103 mmol/L (ref 101–111)
Glucose, Bld: 159 mg/dL — ABNORMAL HIGH (ref 65–99)
Potassium: 3.2 mmol/L — ABNORMAL LOW (ref 3.5–5.1)
Sodium: 135 mmol/L (ref 135–145)

## 2015-09-05 LAB — CBC
HEMATOCRIT: 34.6 % — AB (ref 36.0–46.0)
Hemoglobin: 11.6 g/dL — ABNORMAL LOW (ref 12.0–15.0)
MCH: 29.1 pg (ref 26.0–34.0)
MCHC: 33.5 g/dL (ref 30.0–36.0)
MCV: 86.9 fL (ref 78.0–100.0)
PLATELETS: 136 10*3/uL — AB (ref 150–400)
RBC: 3.98 MIL/uL (ref 3.87–5.11)
RDW: 14.5 % (ref 11.5–15.5)
WBC: 9.8 10*3/uL (ref 4.0–10.5)

## 2015-09-05 MED ORDER — OXYCODONE HCL 5 MG PO TABS: 5.0000 mg | ORAL_TABLET | ORAL | 0 refills | Status: DC | PRN

## 2015-09-05 MED ORDER — TRAMADOL HCL 50 MG PO TABS: 50.0000 mg | ORAL_TABLET | Freq: Four times a day (QID) | ORAL | 1 refills | Status: DC | PRN

## 2015-09-05 MED ORDER — ASPIRIN 325 MG PO TBEC: 325.0000 mg | DELAYED_RELEASE_TABLET | Freq: Two times a day (BID) | ORAL | 0 refills | Status: AC

## 2015-09-05 MED ORDER — METHOCARBAMOL 500 MG PO TABS: 500.0000 mg | ORAL_TABLET | Freq: Four times a day (QID) | ORAL | 0 refills | Status: DC | PRN

## 2015-09-05 NOTE — Discharge Instructions (Signed)
° °Dr. Frank Aluisio °Total Joint Specialist °[facility] °[address] °[place], [place] [address] °[phone] ° °ANTERIOR APPROACH TOTAL HIP REPLACEMENT POSTOPERATIVE DIRECTIONS ° ° °Hip Rehabilitation, Guidelines Following Surgery  °The results of a hip operation are greatly improved after range of motion and muscle strengthening exercises. Follow all safety measures which are given to protect your hip. If any of these exercises cause increased pain or swelling in your joint, decrease the amount until you are comfortable again. Then slowly increase the exercises. Call your caregiver if you have problems or questions.  ° °HOME CARE INSTRUCTIONS  °Remove items at home which could result in a fall. This includes throw rugs or furniture in walking pathways.  °· ICE to the affected hip every three hours for 30 minutes at a time and then as needed for pain and swelling.  Continue to use ice on the hip for pain and swelling from surgery. You may notice swelling that will progress down to the foot and ankle.  This is normal after surgery.  Elevate the leg when you are not up walking on it.   °· Continue to use the breathing machine which will help keep your temperature down.  It is common for your temperature to cycle up and down following surgery, especially at night when you are not up moving around and exerting yourself.  The breathing machine keeps your lungs expanded and your temperature down. ° ° °DIET °You may resume your previous home diet once your are discharged from the hospital. ° °DRESSING / WOUND CARE / SHOWERING °You may shower 3 days after surgery, but keep the wounds dry during showering.  You may use an occlusive plastic wrap (Press'n Seal for example), NO SOAKING/SUBMERGING IN THE BATHTUB.  If the bandage gets wet, change with a clean dry gauze.  If the incision gets wet, pat the wound dry with a clean towel. °You may start showering once you are discharged home but do not  submerge the incision under water. Just pat the incision dry and apply a dry gauze dressing on daily. °Change the surgical dressing daily and reapply a dry dressing each time. ° °ACTIVITY °Walk with your walker as instructed. °Use walker as long as suggested by your caregivers. °Avoid periods of inactivity such as sitting longer than an hour when not asleep. This helps prevent blood clots.  °You may resume a sexual relationship in one month or when given the OK by your doctor.  °You may return to work once you are cleared by your doctor.  °Do not drive a car for 6 weeks or until released by you surgeon.  °Do not drive while taking narcotics. ° °WEIGHT BEARING °Weight bearing as tolerated with assist device (walker, cane, etc) as directed, use it as long as suggested by your surgeon or therapist, typically at least 4-6 weeks. ° °POSTOPERATIVE CONSTIPATION PROTOCOL °Constipation - defined medically as fewer than three stools per week and severe constipation as less than one stool per week. ° °One of the most common issues patients have following surgery is constipation.  Even if you have a regular bowel pattern at home, your normal regimen is likely to be disrupted due to multiple reasons following surgery.  Combination of anesthesia, postoperative narcotics, change in appetite and fluid intake all can affect your bowels.  In order to avoid complications following surgery, here are some recommendations in order to help you during your recovery period. ° °Colace (docusate) - Pick up an over-the-counter   form of Colace or another stool softener and take twice a day as long as you are requiring postoperative pain medications.  Take with a full glass of water daily.  If you experience loose stools or diarrhea, hold the colace until you stool forms back up.  If your symptoms do not get better within 1 week or if they get worse, check with your doctor. ° °Dulcolax (bisacodyl) - Pick up over-the-counter and take as directed  by the product packaging as needed to assist with the movement of your bowels.  Take with a full glass of water.  Use this product as needed if not relieved by Colace only.  ° °MiraLax (polyethylene glycol) - Pick up over-the-counter to have on hand.  MiraLax is a solution that will increase the amount of water in your bowels to assist with bowel movements.  Take as directed and can mix with a glass of water, juice, soda, coffee, or tea.  Take if you go more than two days without a movement. °Do not use MiraLax more than once per day. Call your doctor if you are still constipated or irregular after using this medication for 7 days in a row. ° °If you continue to have problems with postoperative constipation, please contact the office for further assistance and recommendations.  If you experience "the worst abdominal pain ever" or develop nausea or vomiting, please contact the office immediatly for further recommendations for treatment. ° °ITCHING ° If you experience itching with your medications, try taking only a single pain pill, or even half a pain pill at a time.  You can also use Benadryl over the counter for itching or also to help with sleep.  ° °TED HOSE STOCKINGS °Wear the elastic stockings on both legs for three weeks following surgery during the day but you may remove then at night for sleeping. ° °MEDICATIONS °See your medication summary on the “After Visit Summary” that the nursing staff will review with you prior to discharge.  You may have some home medications which will be placed on hold until you complete the course of blood thinner medication.  It is important for you to complete the blood thinner medication as prescribed by your surgeon.  Continue your approved medications as instructed at time of discharge. ° °PRECAUTIONS °If you experience chest pain or shortness of breath - call 911 immediately for transfer to the hospital emergency department.  °If you develop a fever greater that 101 F,  purulent drainage from wound, increased redness or drainage from wound, foul odor from the wound/dressing, or calf pain - CONTACT YOUR SURGEON.   °                                                °FOLLOW-UP APPOINTMENTS °Make sure you keep all of your appointments after your operation with your surgeon and caregivers. You should call the office at the above phone number and make an appointment for approximately two weeks after the date of your surgery or on the date instructed by your surgeon outlined in the "After Visit Summary". ° °RANGE OF MOTION AND STRENGTHENING EXERCISES  °These exercises are designed to help you keep full movement of your hip joint. Follow your caregiver's or physical therapist's instructions. Perform all exercises about fifteen times, three times per day or as directed. Exercise both hips, even if you   have had only one joint replacement. These exercises can be done on a training (exercise) mat, on the floor, on a table or on a bed. Use whatever works the best and is most comfortable for you. Use music or television while you are exercising so that the exercises are a pleasant break in your day. This will make your life better with the exercises acting as a break in routine you can look forward to.  Lying on your back, slowly slide your foot toward your buttocks, raising your knee up off the floor. Then slowly slide your foot back down until your leg is straight again.  Lying on your back spread your legs as far apart as you can without causing discomfort.  Lying on your side, raise your upper leg and foot straight up from the floor as far as is comfortable. Slowly lower the leg and repeat.  Lying on your back, tighten up the muscle in the front of your thigh (quadriceps muscles). You can do this by keeping your leg straight and trying to raise your heel off the floor. This helps strengthen the largest muscle supporting your knee.  Lying on your back, tighten up the muscles of your  buttocks both with the legs straight and with the knee bent at a comfortable angle while keeping your heel on the floor.   IF YOU ARE TRANSFERRED TO A SKILLED REHAB FACILITY If the patient is transferred to a skilled rehab facility following release from the hospital, a list of the current medications will be sent to the facility for the patient to continue.  When discharged from the skilled rehab facility, please have the facility set up the patient's Home Health Physical Therapy prior to being released. Also, the skilled facility will be responsible for providing the patient with their medications at time of release from the facility to include their pain medication, the muscle relaxants, and their blood thinner medication. If the patient is still at the rehab facility at time of the two week follow up appointment, the skilled rehab facility will also need to assist the patient in arranging follow up appointment in our office and any transportation needs.  MAKE SURE YOU:  Understand these instructions.  Get help right away if you are not doing well or get worse.    Pick up stool softner and laxative for home use following surgery while on pain medications. Do not submerge incision under water. Please use good hand washing techniques while changing dressing each day. May shower starting three days after surgery. Please use a clean towel to pat the incision dry following showers. Continue to use ice for pain and swelling after surgery. Do not use any lotions or creams on the incision until instructed by your surgeon.  Take a full dose 325 mg Aspirin twice a day for three weeks, then reduce to a baby 81 mg Aspirin daily for three additional weeks.

## 2015-09-05 NOTE — Care Management Note (Addendum)
Case Management Note  Patient Details  Name: Darlene Johnson MRN: [id number] Date of Birth: 08-24-1940  Subjective/Objective:                  LEFT TOTAL HIP ARTHROPLASTY ANTERIOR APPROACH (Left) Action/Plan: Discharge planning Expected Discharge Date:                  Expected Discharge Plan:  [place]  In-House Referral:     Discharge planning Services  CM Consult  Post Acute Care Choice:  Home Health Choice offered to:  Patient  DME Arranged:  N/A DME Agency:  NA  HH Arranged:  PT [place] Agency:  [facility] (now [facility])  Status of Service:  Completed, signed off  If discussed at [employer] of Stay Meetings, dates discussed:    Additional Comments: CM was notified by [facility] rep, tim [facility] is no longer servicing are where pt lives.  CM notified pt and offered choice.  [facility] called [facility] and spoke with Shirlean Mylar [phone] who requested I fax facesheet, order, face to face, H&P, OP note, PT Eval note and last progress note.  CM received receipt of confirmation.  NO other CM needs were communicated. CM met with pt in room to offer choice of home health agency.  Pt chooses [facility] to render HHPT.  Referral given to [employer], Tim.  Pt has DMe from previous surgery.  No other CM needs were communicated. [name], RN 09/05/2015, 12:18 PM

## 2015-09-05 NOTE — Discharge Summary (Signed)
Physician Discharge Summary   Patient ID: Darlene Johnson MRN: [id number] DOB/AGE: Feb 13, 1940 75 y.o.  Admit date: 09/04/2015 Discharge date: 09/05/2015  Primary Diagnosis:  Osteoarthritis of the Left hip.   Admission Diagnoses:  Past Medical History:  Diagnosis Date  . Arthritis    osteoarthritis. -hips.  . Asthma    childhood  only -occ. allergies to environmental elemnts  . Concussion    age 61 -hospital visit x 3 days."Hit head on cement"  . Fracture of patella, left, closed    age 19  . Hemorrhoids    "more internal"- no problems at problems  . History of palpitations    related to stress- no recent problems  . Hypertension    Discharge Diagnoses:   Active Problems:   OA (osteoarthritis) of hip  Estimated body mass index is 29.08 kg/m as calculated from the following:   Height as of this encounter: 5' 2" (1.575 m).   Weight as of this encounter: 72.1 kg (159 lb).  Procedure(s) (LRB): LEFT TOTAL HIP ARTHROPLASTY ANTERIOR APPROACH (Left)   Consults: None  HPI: Darlene Johnson is a 75 y.o. female who has advanced end-  stage arthritis of their Left  hip with progressively worsening pain and  dysfunction.The patient has failed nonoperative management and presents for  total hip arthroplasty.  Laboratory Data: Admission on 09/04/2015  Component Date Value Ref Range Status  . WBC 09/05/2015 9.8  4.0 - 10.5 K/uL Final  . RBC 09/05/2015 3.98  3.87 - 5.11 MIL/uL Final  . Hemoglobin 09/05/2015 11.6* 12.0 - 15.0 g/dL Final  . HCT 09/05/2015 34.6* 36.0 - 46.0 % Final  . MCV 09/05/2015 86.9  78.0 - 100.0 fL Final  . MCH 09/05/2015 29.1  26.0 - 34.0 pg Final  . MCHC 09/05/2015 33.5  30.0 - 36.0 g/dL Final  . RDW 09/05/2015 14.5  11.5 - 15.5 % Final  . Platelets 09/05/2015 136* 150 - 400 K/uL Final  . Sodium 09/05/2015 135  135 - 145 mmol/L Final  . Potassium 09/05/2015 3.2* 3.5 - 5.1 mmol/L Final  . Chloride 09/05/2015 103  101 - 111 mmol/L Final  . CO2 09/05/2015 26  22 -  32 mmol/L Final  . Glucose, Bld 09/05/2015 159* 65 - 99 mg/dL Final  . BUN 09/05/2015 11  6 - 20 mg/dL Final  . Creatinine, Ser 09/05/2015 0.63  0.44 - 1.00 mg/dL Final  . Calcium 09/05/2015 8.4* 8.9 - 10.3 mg/dL Final  . GFR calc non Af Amer 09/05/2015 >60  >60 mL/min Final  . GFR calc Af Amer 09/05/2015 >60  >60 mL/min Final   Comment: (NOTE) The eGFR has been calculated using the CKD EPI equation. This calculation has not been validated in all clinical situations. eGFR's persistently <60 mL/min signify possible Chronic Kidney Disease.   [name] gap 09/05/2015 6  5 - 15 Final  Hospital Outpatient Visit on 08/27/2015  Component Date Value Ref Range Status  . aPTT 08/27/2015 28  24 - 37 seconds Final  . GFR calc non Af Amer 08/27/2015 NOT CALCULATED  >60 mL/min Final  . GFR calc Af Amer 08/27/2015 NOT CALCULATED  >60 mL/min Final   Comment: (NOTE) The eGFR has been calculated using the CKD EPI equation. This calculation has not been validated in all clinical situations. eGFR's persistently <60 mL/min signify possible Chronic Kidney Disease.   Marland Kitchen Prothrombin Time 08/27/2015 14.1  11.6 - 15.2 seconds Final  . INR 08/27/2015 1.07  0.00 - 1.49  Final  . ABO/RH(D) 09/04/2015 O NEG   Final  . Antibody Screen 09/04/2015 NEG   Final  . Sample Expiration 09/04/2015 09/07/2015   Final  . Extend sample reason 09/04/2015 NO TRANSFUSIONS OR PREGNANCY IN THE PAST 3 MONTHS   Final  . MRSA, PCR 08/27/2015 NEGATIVE  NEGATIVE Final  . Staphylococcus aureus 08/27/2015 NEGATIVE  NEGATIVE Final   Comment:        The Xpert SA Assay (FDA approved for NASAL specimens in patients over 12 years of age), is one component of a comprehensive surveillance program.  Test performance has been validated by [facility] for patients greater than or equal to 9 year old. It is not intended to diagnose infection nor to guide or monitor treatment.      X-Rays:Dg Pelvis Portable  Result Date:  09/04/2015 CLINICAL DATA:  Left hip replacement EXAM: PORTABLE PELVIS 1-2 VIEWS COMPARISON:  None. FINDINGS: Interval total left hip arthroplasty without failure or complication. Postsurgical changes in the surrounding soft tissues with a surgical drain present. Right total hip arthroplasty. No acute fracture or dislocation. IMPRESSION: Interval total left hip arthroplasty without failure or complication. Electronically Signed   By: Kathreen Devoid   On: 09/04/2015 15:32  Dg C-arm 1-60 Min-no Report  Result Date: 09/04/2015 CLINICAL DATA: surgery C-ARM 1-60 MINUTES Fluoroscopy was utilized by the requesting physician.  No radiographic interpretation.    EKG:No orders found for this or any previous visit.   Hospital Course: Patient was admitted to [facility] and taken to the OR and underwent the above state procedure without complications.  Patient tolerated the procedure well and was later transferred to the recovery room and then to the orthopaedic floor for postoperative care.  They were given PO and IV analgesics for pain control following their surgery.  They were given 24 hours of postoperative antibiotics of  Anti-infectives    Start     Dose/Rate Route Frequency Ordered Stop   09/04/15 2000  ceFAZolin (ANCEF) IVPB 2g/100 mL premix     2 g 200 mL/hr over 30 Minutes Intravenous Every 6 hours 09/04/15 1633 09/05/15 0310   09/04/15 0955  ceFAZolin (ANCEF) IVPB 2g/100 mL premix     2 g 200 mL/hr over 30 Minutes Intravenous On call to O.R. 09/04/15 8341 09/04/15 1336     and started on DVT prophylaxis in the form of Aspirin.   PT and OT were ordered for total hip protocol.  The patient was allowed to be WBAT with therapy. Discharge planning was consulted to help with postop disposition and equipment needs.  Patient had a decent night on the evening of surgery.  They started to get up OOB with therapy on day one.  Hemovac drain was pulled without difficulty.  Dressing was checked on day one  and was clean and day.  Patient was seen in rounds and was ready to go home later on day one after meeting all therapy goals.  Diet - Cardiac diet Follow up - in 2 weeks Activity - WBAT Disposition - Home Condition Upon Discharge - improving D/C Meds - See DC Summary DVT Prophylaxis - Aspirin 325 mg BID for three weeks and then baby 81 mg Aspirin  Discharge Instructions    Call MD / Call 911    Complete by:  As directed   If you experience chest pain or shortness of breath, CALL 911 and be transported to the hospital emergency room.  If you develope a fever above 101  F, pus (white drainage) or increased drainage or redness at the wound, or calf pain, call your surgeon's office.   Change dressing    Complete by:  As directed   You may change your dressing dressing daily with sterile 4 x 4 inch gauze dressing and paper tape.  Do not submerge the incision under water.   Constipation Prevention    Complete by:  As directed   Drink plenty of fluids.  Prune juice may be helpful.  You may use a stool softener, such as Colace (over the counter) 100 mg twice a day.  Use MiraLax (over the counter) for constipation as needed.   Diet - low sodium heart healthy    Complete by:  As directed   Discharge instructions    Complete by:  As directed   Pick up stool softner and laxative for home use following surgery while on pain medications. Do not submerge incision under water. May remove the surgical dressing tomorrow, Friday, and then apply a dry gauze dressing daily. Please use good hand washing techniques while changing dressing each day. May shower starting three days after surgery starting Saturday 09/07/2015. Please use a clean towel to pat the incision dry following showers. Continue to use ice for pain and swelling after surgery. Do not use any lotions or creams on the incision until instructed by your surgeon.  Postoperative Constipation Protocol  Constipation - defined medically as fewer than  three stools per week and severe constipation as less than one stool per week.  One of the most common issues patients have following surgery is constipation. Even if you have a regular bowel pattern at home, your normal regimen is likely to be disrupted due to multiple reasons following surgery. Combination of anesthesia, postoperative narcotics, change in appetite and fluid intake all can affect your bowels. In order to avoid complications following surgery, here are some recommendations in order to help you during your recovery period.  Colace (docusate) - Pick up an over-the-counter form of Colace or another stool softener and take twice a day as long as you are requiring postoperative pain medications. Take with a full glass of water daily. If you experience loose stools or diarrhea, hold the colace until you stool forms back up. If your symptoms do not get better within 1 week or if they get worse, check with your doctor.  Dulcolax (bisacodyl) - Pick up over-the-counter and take as directed by the product packaging as needed to assist with the movement of your bowels. Take with a full glass of water. Use this product as needed if not relieved by Colace only.   MiraLax (polyethylene glycol) - Pick up over-the-counter to have on hand. MiraLax is a solution that will increase the amount of water in your bowels to assist with bowel movements. Take as directed and can mix with a glass of water, juice, soda, coffee, or tea. Take if you go more than two days without a movement. Do not use MiraLax more than once per day. Call your doctor if you are still constipated or irregular after using this medication for 7 days in a row.  If you continue to have problems with postoperative constipation, please contact the office for further assistance and recommendations. If you experience "the worst abdominal pain ever" or develop nausea or vomiting, please contact the office immediatly for further  recommendations for treatment.   Postoperative Constipation Protocol  Constipation - defined medically as fewer than three stools per week and severe  constipation as less than one stool per week.  One of the most common issues patients have following surgery is constipation.  Even if you have a regular bowel pattern at home, your normal regimen is likely to be disrupted due to multiple reasons following surgery.  Combination of anesthesia, postoperative narcotics, change in appetite and fluid intake all can affect your bowels.  In order to avoid complications following surgery, here are some recommendations in order to help you during your recovery period.  Colace (docusate) - Pick up an over-the-counter form of Colace or another stool softener and take twice a day as long as you are requiring postoperative pain medications.  Take with a full glass of water daily.  If you experience loose stools or diarrhea, hold the colace until you stool forms back up.  If your symptoms do not get better within 1 week or if they get worse, check with your doctor.  Dulcolax (bisacodyl) - Pick up over-the-counter and take as directed by the product packaging as needed to assist with the movement of your bowels.  Take with a full glass of water.  Use this product as needed if not relieved by Colace only.   MiraLax (polyethylene glycol) - Pick up over-the-counter to have on hand.  MiraLax is a solution that will increase the amount of water in your bowels to assist with bowel movements.  Take as directed and can mix with a glass of water, juice, soda, coffee, or tea.  Take if you go more than two days without a movement. Do not use MiraLax more than once per day. Call your doctor if you are still constipated or irregular after using this medication for 7 days in a row.  If you continue to have problems with postoperative constipation, please contact the office for further assistance and recommendations.  If you  experience "the worst abdominal pain ever" or develop nausea or vomiting, please contact the office immediatly for further recommendations for treatment.   Take a full dose 325 mg Aspirin twice a day for three weeks, then reduce to a baby 81 mg Aspirin daily for three additional weeks.   Do not sit on low chairs, stoools or toilet seats, as it may be difficult to get up from low surfaces    Complete by:  As directed   Driving restrictions    Complete by:  As directed   No driving until released by the physician.   Increase activity slowly as tolerated    Complete by:  As directed   Lifting restrictions    Complete by:  As directed   No lifting until released by the physician.   Patient may shower    Complete by:  As directed   You may shower without a dressing once there is no drainage.  Do not wash over the wound.  If drainage remains, do not shower until drainage stops.   TED hose    Complete by:  As directed   Use stockings (TED hose) for 3 weeks on both leg(s).  You may remove them at night for sleeping.   Weight bearing as tolerated    Complete by:  As directed   Laterality:  left   Extremity:  Lower       Medication List    STOP taking these medications   CALCIUM 600 + D PO   cholecalciferol 1000 units tablet Commonly known as:  VITAMIN D   Flaxseed Oil 1000 MG Caps   ibuprofen 200  MG tablet Commonly known as:  ADVIL,MOTRIN   Turmeric 450 MG Caps   vitamin B-12 1000 MCG tablet Commonly known as:  CYANOCOBALAMIN   zinc gluconate 50 MG tablet     TAKE these medications   acetaminophen 500 MG tablet Commonly known as:  TYLENOL Take 500 mg by mouth every 8 (eight) hours as needed (pain).   aspirin 325 MG EC tablet Take 1 tablet (325 mg total) by mouth 2 (two) times daily. Take a full dose 325 mg Aspirin twice a day for three weeks, then reduce to a baby 81 mg Aspirin daily for three additional weeks.   cetirizine 10 MG tablet Commonly known as:  ZYRTEC Take 10 mg  by mouth at bedtime as needed for allergies.   docusate sodium 100 MG capsule Commonly known as:  COLACE Take 200 mg by mouth daily.   hydrochlorothiazide 25 MG tablet Commonly known as:  HYDRODIURIL Take 25 mg by mouth daily.   methocarbamol 500 MG tablet Commonly known as:  ROBAXIN Take 1 tablet (500 mg total) by mouth every 6 (six) hours as needed for muscle spasms.   oxyCODONE 5 MG immediate release tablet Commonly known as:  Oxy IR/ROXICODONE Take 1-2 tablets (5-10 mg total) by mouth every 3 (three) hours as needed for moderate pain or severe pain.   potassium chloride SA 20 MEQ tablet Commonly known as:  K-DUR,KLOR-CON Take 20 mEq by mouth daily.   psyllium 0.52 g capsule Commonly known as:  REGULOID Take 1.04 g by mouth daily.   traMADol 50 MG tablet Commonly known as:  ULTRAM Take 1-2 tablets (50-100 mg total) by mouth every 6 (six) hours as needed (mild pain).      Follow-up Information    Gearlean Alf, MD. Schedule an appointment as soon as possible for a visit on 09/19/2015.   Specialty:  Orthopedic Surgery Why:  Call office at [phone] to setup appointment with Dr. Anne Fu staff. Contact information: [address] [place] [address] [phone]           Signed: Arlee Muslim, PA-C Orthopaedic Surgery 09/05/2015, 7:46 AM

## 2015-09-05 NOTE — Progress Notes (Signed)
Occupational Therapy Evaluation Patient Details Name: Darlene Johnson MRN: [id number] DOB: [birth year] Today's Date: 09/05/2015    History of Present Illness s/p LEFT TOTAL HIP ARTHROPLASTY ANTERIOR APPROACH (Left). 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   Clinical Impression   All OT education completed and pt questions answered. No further OT needs at this time; will sign off.    Follow Up Recommendations  No OT follow up;Supervision - Intermittent    Equipment Recommendations  None recommended by OT    Recommendations for Other Services PT consult     Precautions / Restrictions Precautions Precautions: None Restrictions Weight Bearing Restrictions: No Other Position/Activity Restrictions: WBAT      Mobility Bed Mobility               General bed mobility comments: NT -- up in recliner  Transfers Overall transfer level: Needs assistance Equipment used: Rolling walker (2 wheeled) Transfers: Sit to/from Stand Sit to Stand: Supervision         General transfer comment: cues for LLE management during transfers    Balance                                            ADL Overall ADL's : Needs assistance/impaired Eating/Feeding: Independent;Sitting   Grooming: Wash/dry hands;Supervision/safety;Standing   Upper Body Bathing: Set up;Sitting   Lower Body Bathing: Minimal assistance;Sit to/from stand   Upper Body Dressing : Set up;Sitting   Lower Body Dressing: Minimal assistance;Sit to/from stand   Toilet Transfer: Supervision/safety;Ambulation;BSC;RW   Toileting- Clothing Manipulation and Hygiene: Supervision/safety;Sit to/from stand   Tub/ Shower Transfer: Education officer, environmental Details (indicate cue type and reason): Reviewed technique but pt declined to practice; reports she feels comfortable with this transfer from doing it last time. Functional mobility during ADLs: Supervision/safety;Rolling walker General ADL Comments: Daughter  will assist patient at discharge PRN.     Vision     Perception     Praxis      Pertinent Vitals/Pain Pain Assessment: 0-10 Pain Score: 6  Pain Location: L hip/thigh Pain Descriptors / Indicators: Aching;Burning;Sore;Stabbing;Tightness Pain Intervention(s): Monitored during session;Repositioned;RN gave pain meds during session;Ice applied     Hand Dominance     Extremity/Trunk Assessment Upper Extremity Assessment Upper Extremity Assessment: Overall WFL for tasks assessed   Lower Extremity Assessment Lower Extremity Assessment: Defer to PT evaluation       Communication Communication Communication: No difficulties   Cognition Arousal/Alertness: Awake/alert Behavior During Therapy: WFL for tasks assessed/performed Overall Cognitive Status: Within Functional Limits for tasks assessed                     General Comments       Exercises       Shoulder Instructions      Home Living Family/patient expects to be discharged to:: Private residence Living Arrangements: Alone Available Help at Discharge: Family;Available 24 hours/day Type of Home: House Home Access: Level entry     Home Layout: One level     Bathroom Shower/Tub: Producer, television/film/video: Standard Bathroom Accessibility: Yes How Accessible: Accessible via walker Home Equipment: Walker - 4 wheels;Walker - 2 wheels;Cane - single point;Bedside commode;Grab bars - tub/shower          Prior Functioning/Environment Level of Independence: Independent             OT Diagnosis: Acute  pain   OT Problem List: Decreased strength;Decreased range of motion;Pain   OT Treatment/Interventions:      OT Goals(Current goals can be found in the care plan section) Acute Rehab OT Goals Patient Stated Goal: decreased pain OT Goal Formulation: All assessment and education complete, DC therapy  OT Frequency:     Barriers to D/C:            Co-evaluation              End of  Session Equipment Utilized During Treatment: Rolling walker Nurse Communication: Patient requests pain meds  Activity Tolerance: Patient tolerated treatment well Patient left: in chair;with call bell/phone within reach   Time: 1117-1135 OT Time Calculation (min): 18 min Charges:  OT General Charges $OT Visit: 1 Procedure OT Evaluation $OT Eval Low Complexity: 1 Procedure G-Codes:    Janard Culp A 09/06/2015, 12:02 PM

## 2015-09-05 NOTE — Progress Notes (Signed)
Patient has met all therapy goals and very comfortable with going home today. Pain is minimal and well controlled on oral pain medication.   Pt to d/c home with [facility]. No DME needs. AVS reviewed and "My Chart" discussed with pt. Pt capable of verbalizing medications, dressing changes, signs and symptoms of infection, and follow-up appointments. Remains hemodynamically stable. No signs and symptoms of distress. Educated pt to return to ER in the case of SOB, dizziness, or chest pain.

## 2015-09-05 NOTE — Evaluation (Signed)
Physical Therapy Evaluation Patient Details Name: ODETH KITCHENS MRN: [id number] DOB: 09-Jul-1940 Today's Date: 09/05/2015   History of Present Illness  s/p LEFT TOTAL HIP ARTHROPLASTY ANTERIOR APPROACH (Left). 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  Clinical Impression  The patient is progressing well. Patient reports that the Left leg feels longer. The patient  Will benefit from PT while in acute care. Patient may DC today depending on  Progress.    Follow Up Recommendations Home health PT;Supervision/Assistance - 24 hour    Equipment Recommendations  None recommended by PT    Recommendations for Other Services       Precautions / Restrictions Precautions Precautions: Fall Restrictions Weight Bearing Restrictions: No Other Position/Activity Restrictions: WBAT      Mobility  Bed Mobility Overal bed mobility: Needs Assistance Bed Mobility: Supine to Sit     Supine to sit: Min assist     General bed mobility comments: support the left leg  Transfers Overall transfer level: Needs assistance Equipment used: Rolling walker (2 wheeled) Transfers: Sit to/from Stand Sit to Stand: Supervision         General transfer comment: cues for LLE management during transfers  Ambulation/Gait Ambulation/Gait assistance: Min assist Ambulation Distance (Feet): 135 Feet Assistive device: Rolling walker (2 wheeled) Gait Pattern/deviations: Step-to pattern     General Gait Details: cues for sequence and position inside the RW  Stairs            Wheelchair Mobility    Modified Rankin (Stroke Patients Only)       Balance                                             Pertinent Vitals/Pain Pain Assessment: 0-10 Pain Score: 4  Pain Location: L hip and thigh Pain Descriptors / Indicators: Aching;Sore Pain Intervention(s): Limited activity within patient's tolerance;Monitored during session;Premedicated before session;Ice applied    Home Living Family/patient  expects to be discharged to:: Private residence Living Arrangements: Alone Available Help at Discharge: Family;Available 24 hours/day Type of Home: House Home Access: Level entry     Home Layout: One level Home Equipment: Walker - 4 wheels;Walker - 2 wheels;Cane - single point;Bedside commode;Grab bars - tub/shower      Prior Function Level of Independence: Independent               Hand Dominance        Extremity/Trunk Assessment   Upper Extremity Assessment: Defer to OT evaluation           Lower Extremity Assessment: LLE deficits/detail   LLE Deficits / Details: . patient reports that the leg feels longer when she first stood up  Cervical / Trunk Assessment: Normal  Communication   Communication: No difficulties  Cognition Arousal/Alertness: Awake/alert Behavior During Therapy: WFL for tasks assessed/performed Overall Cognitive Status: Within Functional Limits for tasks assessed                      General Comments      Exercises Total Joint Exercises Ankle Circles/Pumps: AROM;Both;10 reps Quad Sets: AROM;Both;10 reps Short Arc Quad: AROM;Left;10 reps Heel Slides: AROM;Left;10 reps Hip ABduction/ADduction: AROM;Left;10 reps      Assessment/Plan    PT Assessment Patient needs continued PT services  PT Diagnosis Difficulty walking;Acute pain   PT Problem List Decreased strength;Decreased range of motion;Decreased activity tolerance;Decreased mobility;Decreased knowledge of  use of DME;Decreased safety awareness;Decreased knowledge of precautions  PT Treatment Interventions DME instruction;Gait training;Stair training;Functional mobility training;Therapeutic activities;Therapeutic exercise;Patient/family education   PT Goals (Current goals can be found in the Care Plan section) Acute Rehab PT Goals Patient Stated Goal: decreased pain PT Goal Formulation: With patient Time For Goal Achievement: 09/07/15 Potential to Achieve Goals: Good     Frequency 7X/week   Barriers to discharge Decreased caregiver support      Co-evaluation               End of Session   Activity Tolerance: Patient tolerated treatment well Patient left: in chair;with call bell/phone within reach Nurse Communication: Mobility status         Time: 1610-9604 PT Time Calculation (min) (ACUTE ONLY): 29 min   Charges:   PT Evaluation $PT Eval Low Complexity: 1 Procedure PT Treatments $Gait Training: 8-22 mins   PT G Codes:        Rada Hay 09/05/2015, 1:06 PM [name] PT [phone]

## 2015-09-05 NOTE — Progress Notes (Signed)
   Subjective: 1 Day Post-Op Procedure(s) (LRB): LEFT TOTAL HIP ARTHROPLASTY ANTERIOR APPROACH (Left) Patient reports pain as mild.   Patient seen in rounds by Dr. Lequita Halt. Patient is well, and has had no acute complaints or problems We will start therapy today.  If they do well with therapy and meets all goals, then will allow home later this afternoon following therapy. Plan is to go Home after hospital stay.  Objective: Vital signs in last 24 hours: Temp:  [97.4 F (36.3 C)-98.3 F (36.8 C)] 98.3 F (36.8 C) (07/27 0637) Pulse Rate:  [54-80] 54 (07/27 0637) Resp:  [13-18] 16 (07/27 0637) BP: (96-133)/(37-70) 113/37 (07/27 0637) SpO2:  [96 %-100 %] 98 % (07/27 0637) Weight:  [72.1 kg (159 lb)] 72.1 kg (159 lb) (07/26 1022)  Intake/Output from previous day:  Intake/Output Summary (Last 24 hours) at 09/05/15 0736 Last data filed at 09/05/15 0639  Gross per 24 hour  Intake             1710 ml  Output             2080 ml  Net             -370 ml    Intake/Output this shift: No intake/output data recorded.  Labs:  Recent Labs  09/05/15 0433  HGB 11.6*    Recent Labs  09/05/15 0433  WBC 9.8  RBC 3.98  HCT 34.6*  PLT 136*    Recent Labs  09/05/15 0433  NA 135  K 3.2*  CL 103  CO2 26  BUN 11  CREATININE 0.63  GLUCOSE 159*  CALCIUM 8.4*   No results for input(s): LABPT, INR in the last 72 hours.  EXAM General - Patient is Alert, Appropriate and Oriented Extremity - Neurovascular intact Sensation intact distally Dorsiflexion/Plantar flexion intact Dressing - dressing C/D/I Motor Function - intact, moving foot and toes well on exam.  Hemovac pulled without difficulty.  Past Medical History:  Diagnosis Date  . Arthritis    osteoarthritis. -hips.  . Asthma    childhood  only -occ. allergies to environmental elemnts  . Concussion    age 89 -hospital visit x 3 days."Hit head on cement"  . Fracture of patella, left, closed    age 64  . Hemorrhoids      "more internal"- no problems at problems  . History of palpitations    related to stress- no recent problems  . Hypertension     Assessment/Plan: 1 Day Post-Op Procedure(s) (LRB): LEFT TOTAL HIP ARTHROPLASTY ANTERIOR APPROACH (Left) Active Problems:   OA (osteoarthritis) of hip  Estimated body mass index is 29.08 kg/m as calculated from the following:   Height as of this encounter: 5\' 2"  (1.575 m).   Weight as of this encounter: 72.1 kg (159 lb). Advance diet Up with therapy Discharge home with home health if does well and meets goals  DVT Prophylaxis - Aspirin 325 mg BID for three weeks and then baby 81 mg Aspirin Weight Bearing As Tolerated left Leg Hemovac Pulled Begin Therapy  If meets goals and able to go home: Diet - Cardiac diet Follow up - in 2 weeks Activity - WBAT Disposition - Home Condition Upon Discharge - improving D/C Meds - See DC Summary DVT Prophylaxis - Aspirin 325 mg BID for three weeks and then baby 81 mg Aspirin  Avel Peace, PA-C Orthopaedic Surgery 09/05/2015, 7:36 AM

## 2015-09-05 NOTE — Progress Notes (Signed)
Physical Therapy Treatment Patient Details Name: Darlene Johnson MRN: [id number] DOB: [birth year] Today's Date: 09/05/2015    History of Present Illness s/p LEFT TOTAL HIP ARTHROPLASTY ANTERIOR APPROACH (Left). 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    PT Comments    Ready for DC   Follow Up Recommendations  Home health PT;Supervision/Assistance - 24 hour     Equipment Recommendations  None recommended by PT    Recommendations for Other Services       Precautions / Restrictions Precautions Precautions: Fall Restrictions Weight Bearing Restrictions: No Other Position/Activity Restrictions: WBAT    Mobility  Bed Mobility Overal bed mobility: Needs Assistance Bed Mobility: Supine to Sit     Supine to sit: Min assist     General bed mobility comments: support the left leg  Transfers Overall transfer level: Modified independent Equipment used: Rolling walker (2 wheeled) Transfers: Sit to/from Stand Sit to Stand: Supervision         General transfer comment: cues for LLE management during transfers  Ambulation/Gait Ambulation/Gait assistance: Supervision Ambulation Distance (Feet): 175 Feet Assistive device: Rolling walker (2 wheeled) Gait Pattern/deviations: Step-through pattern     General Gait Details: placed R shoe on, patient did not feel there was a difference   Stairs            Wheelchair Mobility    Modified Rankin (Stroke Patients Only)       Balance                                    Cognition Arousal/Alertness: Awake/alert Behavior During Therapy: WFL for tasks assessed/performed Overall Cognitive Status: Within Functional Limits for tasks assessed                      Exercises Total Joint Exercises Ankle Circles/Pumps: AROM;Both;10 reps Quad Sets: AROM;Both;10 reps Short Arc Quad: AROM;Left;10 reps Heel Slides: AROM;Left;10 reps Hip ABduction/ADduction: AROM;Left;10 reps    General Comments        Pertinent  Vitals/Pain Pain Assessment: 0-10 Pain Score: 3  Pain Location: L hip Pain Descriptors / Indicators: Sore Pain Intervention(s): Monitored during session;Premedicated before session    Home Living Family/patient expects to be discharged to:: Private residence Living Arrangements: Alone Available Help at Discharge: Family;Available 24 hours/day Type of Home: House Home Access: Level entry   Home Layout: One level Home Equipment: Walker - 4 wheels;Walker - 2 wheels;Cane - single point;Bedside commode;Grab bars - tub/shower      Prior Function Level of Independence: Independent          PT Goals (current goals can now be found in the care plan section) Acute Rehab PT Goals Patient Stated Goal: decreased pain PT Goal Formulation: With patient Time For Goal Achievement: 09/07/15 Potential to Achieve Goals: Good Progress towards PT goals: Progressing toward goals    Frequency  7X/week    PT Plan Current plan remains appropriate    Co-evaluation             End of Session   Activity Tolerance: Patient tolerated treatment well Patient left: in chair;with family/visitor present     Time: 1401-1430 PT Time Calculation (min) (ACUTE ONLY): 29 min  Charges:  $Gait Training: 8-22 mins $Self Care/Home Management: 8-22                    G Codes:      Darlene Johnson  Darlene Johnson 09/05/2015, 2:35 PM

## 2016-04-09 ENCOUNTER — Encounter: Payer: Self-pay | Admitting: Internal Medicine

## 2016-05-19 ENCOUNTER — Ambulatory Visit: Payer: Medicare Other | Admitting: Internal Medicine

## 2016-05-26 ENCOUNTER — Other Ambulatory Visit (INDEPENDENT_AMBULATORY_CARE_PROVIDER_SITE_OTHER): Payer: Medicare Other

## 2016-05-26 ENCOUNTER — Encounter (INDEPENDENT_AMBULATORY_CARE_PROVIDER_SITE_OTHER): Payer: Self-pay

## 2016-05-26 ENCOUNTER — Encounter: Payer: Self-pay | Admitting: Internal Medicine

## 2016-05-26 ENCOUNTER — Ambulatory Visit (INDEPENDENT_AMBULATORY_CARE_PROVIDER_SITE_OTHER): Payer: Medicare Other | Admitting: Internal Medicine

## 2016-05-26 VITALS — BP 128/80 | HR 64 | Ht 62.0 in | Wt 172.0 lb

## 2016-05-26 DIAGNOSIS — R1031 Right lower quadrant pain: Secondary | ICD-10-CM | POA: Diagnosis not present

## 2016-05-26 DIAGNOSIS — K5909 Other constipation: Secondary | ICD-10-CM

## 2016-05-26 DIAGNOSIS — K648 Other hemorrhoids: Secondary | ICD-10-CM

## 2016-05-26 DIAGNOSIS — F439 Reaction to severe stress, unspecified: Secondary | ICD-10-CM

## 2016-05-26 DIAGNOSIS — K588 Other irritable bowel syndrome: Secondary | ICD-10-CM

## 2016-05-26 LAB — CBC WITH DIFFERENTIAL/PLATELET
BASOS ABS: 0 10*3/uL (ref 0.0–0.1)
Basophils Relative: 0.5 % (ref 0.0–3.0)
Eosinophils Absolute: 0.1 10*3/uL (ref 0.0–0.7)
Eosinophils Relative: 1.4 % (ref 0.0–5.0)
HEMATOCRIT: 44.6 % (ref 36.0–46.0)
HEMOGLOBIN: 14.9 g/dL (ref 12.0–15.0)
LYMPHS ABS: 1.9 10*3/uL (ref 0.7–4.0)
Lymphocytes Relative: 27.4 % (ref 12.0–46.0)
MCHC: 33.4 g/dL (ref 30.0–36.0)
MCV: 89.7 fl (ref 78.0–100.0)
MONOS PCT: 7.9 % (ref 3.0–12.0)
Monocytes Absolute: 0.5 10*3/uL (ref 0.1–1.0)
Neutro Abs: 4.3 10*3/uL (ref 1.4–7.7)
Neutrophils Relative %: 62.8 % (ref 43.0–77.0)
Platelets: 161 10*3/uL (ref 150.0–400.0)
RBC: 4.97 Mil/uL (ref 3.87–5.11)
RDW: 15 % (ref 11.5–15.5)
WBC: 6.8 10*3/uL (ref 4.0–10.5)

## 2016-05-26 LAB — TSH: TSH: 1.42 u[IU]/mL (ref 0.35–4.50)

## 2016-05-26 LAB — COMPREHENSIVE METABOLIC PANEL
ALBUMIN: 4.2 g/dL (ref 3.5–5.2)
ALK PHOS: 67 U/L (ref 39–117)
ALT: 15 U/L (ref 0–35)
AST: 22 U/L (ref 0–37)
BILIRUBIN TOTAL: 0.5 mg/dL (ref 0.2–1.2)
BUN: 13 mg/dL (ref 6–23)
CALCIUM: 9.6 mg/dL (ref 8.4–10.5)
CO2: 29 mEq/L (ref 19–32)
Chloride: 105 mEq/L (ref 96–112)
Creatinine, Ser: 0.7 mg/dL (ref 0.40–1.20)
GFR: 87.05 mL/min (ref >60.00)
GLUCOSE: 93 mg/dL (ref 70–99)
POTASSIUM: 4.4 meq/L (ref 3.5–5.1)
Sodium: 139 mEq/L (ref 135–145)
TOTAL PROTEIN: 6.6 g/dL (ref 6.0–8.3)

## 2016-05-26 NOTE — Patient Instructions (Addendum)
Your physician has requested that you go to the basement for the following lab work before leaving today: CBC/diff, CMET, TSH  We will obtain your records for review.   I appreciate the opportunity to care for you. Stan Head, MD, [facility]

## 2016-05-26 NOTE — Progress Notes (Signed)
Darlene Johnson 75 y.o. October 11, 1940 [id number]  Assessment & Plan:   Encounter Diagnoses  Name Primary?  . RLQ abdominal pain Yes  . Other irritable bowel syndrome   . Bleeding internal hemorrhoids   . Situational stress   . occasional constipation     Think the overall constellation of symptoms is related IBS and bleeding hemorrhoids most likely. I will review her most recent colonoscopy. We will check a CBC, chemistries and TSH today. She will continue with what she's doing as she feels her quality of life is reasonable she just wanted an evaluation to see if any other investigations were needed and if there was anything else suspected. She will consider banding of hemorrhoids and let me know if she desires to do so.  Was returned and CBC metabolic panel and TSH are all normal.  Colonoscopy 2015 internal hemorrhoids. Otherwise negative. 2001 colonoscopy demonstrated a few diverticula.  No change in plan.  I appreciate the opportunity to care for this patient. Subjective:   Chief Complaint: Stomach pains and occasional bleeding. I have internal hemorrhoids.  HPI Patient is a very nice elderly white woman, who is complaining of right-sided abdominal discomfort and bloating, some right back discomfort, rectal bleeding 2 or 3 times a month, and more frequent defecation of formed stools in the mornings. She is on a probiotic and uses over-the-counter preparation H ointment and feels like her quality of life is reasonable but she just wonders what these symptoms could be due to. She does have a history of IBS and what is really different is is that she bleeds a little bit more often and that she's having more bowel movements having 2-3 in the mornings as a postal 1 she used to have. There is no change in stool caliber. The bleeding is into the toilet or with wiping but not mixed in with the stool. Once every other month show feel like she needs to take a laxative and wonders if that's okay.  She had a colonoscopy about 3 years ago that was negative except for the hemorrhoids and I think may be diverticulosis, that was in [place] and we have requested those records. She's had other colonoscopies and always had the same results. She is not had unintentional weight loss. She does admit to having been through a lot in the past few years. Her mother died, she had to handle that state and then renovate and sell her mother's house. She had both hips replaced in 2017. He retired. 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 She is also had transient pill dysphagia her old primary care doctor suggested that she stop Advil and she's not had that problem since switching the Tylenol. She is without a primary care provider at this time as her primary care provider left the practice where she went and she has not obtained another.   Current Meds  Medication Sig  . acetaminophen (TYLENOL) 500 MG tablet Take 500 mg by mouth every 8 (eight) hours as needed (pain).  . Calcium Carb-Cholecalciferol (CALCIUM/VITAMIN D) 500-200 MG-UNIT TABS Take 2 tablets by mouth daily.  . cetirizine (ZYRTEC) 10 MG tablet Take 10 mg by mouth at bedtime as needed for allergies.   . Cholecalciferol (VITAMIN D3) 2000 units TABS Take 1 tablet by mouth daily.  . cyanocobalamin 1000 MCG tablet Take 1,000 mcg by mouth  daily.  . docusate sodium (COLACE) 100 MG capsule Take 200 mg by mouth daily.   . Multiple Vitamin (MULTIVITAMIN) tablet Take 1 tablet by mouth daily.  . phenylephrine-shark liver oil-mineral oil-petrolatum (PREPARATION H) 0.25-3-14-71.9 % rectal ointment Place 1 application rectally 2 (two) times daily as needed for hemorrhoids.  . Probiotic Product (ALIGN) 4 MG CAPS Take 1 capsule by mouth daily.  . psyllium (REGULOID) 0.52 g capsule Take 0.52 g by mouth daily.   No Known Allergies Past Medical History:  Diagnosis  Date  . Arthritis    osteoarthritis. -hips.  . Asthma    childhood  only -occ. allergies to environmental elemnts  . Concussion    age 40 -hospital visit x 3 days."Hit head on cement"  . Fracture of patella, left, closed    age 60  . Hemorrhoids    "more internal"- no problems at problems  . History of palpitations    related to stress- no recent problems  . Hypertension    Past Surgical History:  Procedure Laterality Date  . BREAST SURGERY Left    lumpectomy-benign  . COLONOSCOPY    . NASAL SEPTOPLASTY W/ TURBINOPLASTY    . TOTAL HIP ARTHROPLASTY Right 03/20/2015   Procedure: RIGHT TOTAL HIP ARTHROPLASTY ANTERIOR APPROACH;  Surgeon: Ollen Gross, MD;  Location: [facility];  Service: Orthopedics;  Laterality: Right;  . TOTAL HIP ARTHROPLASTY Left 09/04/2015   Procedure: LEFT TOTAL HIP ARTHROPLASTY ANTERIOR APPROACH;  Surgeon: Ollen Gross, MD;  Location: [facility];  Service: Orthopedics;  Laterality: Left;  . WISDOM TOOTH EXTRACTION     Social History   Social History  . Marital status: Divorced    Spouse name: N/A  . Number of children: N/A  . Years of education: N/A   Social History Main Topics  . Smoking status: Never Smoker  . Smokeless tobacco: Never Used  . Alcohol use Yes     Comment: occ.wine   . Drug use: No  . Sexual activity: Not Asked   Other Topics Concern  . None   Social History Narrative   Divorced, one daughter   retired from Agricultural consultant and [employer]   Occasional caffeine consumption   family history includes Colon polyps in her father; Dementia in her mother; Heart disease in her father.   Review of Systems Positive for allergies and sinus problems. All other review of systems negative or as above.  Objective:   Physical Exam  128/80   Pulse 64   Ht  (1.575 m)   Wt 172 lb (78 kg)   BMI 31.46 kg/m @  General:  Well-developed, well-nourished and in no acute distress Eyes:  anicteric. ENT:   Mouth and posterior pharynx free of  lesions.  Neck:   supple w/o thyromegaly or mass.  Lungs: Clear to auscultation bilaterally. Heart:  S1S2, no rubs, murmurs, gallops. Abdomen:  soft, non-tender, no hepatosplenomegaly, hernia, or mass and BS+. obese Rectal: June McMurry CMA present   Anoderm inspection revealed no abnormalities Digital exam revealed normal resting tone and voluntary squeeze. No mass or rectocele present. Simulated defecation with valsalva revealed appropriate abdominal contraction and descent. Formed brown stool   Anoscopy was performed with the patient in the left lateral decubitus position while a chaperone was present and revealed Gr 2 inflamed internal hemorrhoids all positions     Lymph:  no cervical or supraclavicular adenopathy. Extremities:   no edema, cyanosis or clubbing Skin   no rash. Neuro:  A&O x 3.  Psych:  appropriate mood and  Affect.

## 2016-05-28 NOTE — Progress Notes (Signed)
Please let her know these labs are all normal and send her a copy of them. Also that I saw her last colonoscopy in 2015 and it showed hemorrhoids and was otherwise okay so our plan is not changed. She may call and schedule hemorrhoid banding at her convenience if desired.

## 2017-01-20 ENCOUNTER — Encounter: Payer: Self-pay | Admitting: Gastroenterology

## 2017-01-20 ENCOUNTER — Ambulatory Visit (INDEPENDENT_AMBULATORY_CARE_PROVIDER_SITE_OTHER): Payer: Medicare Other | Admitting: Gastroenterology

## 2017-01-20 VITALS — BP 112/68 | HR 74 | Ht 62.5 in | Wt 171.0 lb

## 2017-01-20 DIAGNOSIS — K582 Mixed irritable bowel syndrome: Secondary | ICD-10-CM

## 2017-01-20 DIAGNOSIS — K648 Other hemorrhoids: Secondary | ICD-10-CM | POA: Diagnosis not present

## 2017-01-20 MED ORDER — HYOSCYAMINE SULFATE 0.125 MG SL SUBL: 0.1250 mg | SUBLINGUAL_TABLET | Freq: Four times a day (QID) | SUBLINGUAL | 1 refills | Status: DC | PRN

## 2017-01-20 MED ORDER — HYDROCORTISONE 2.5 % RE CREA
1.0000 | TOPICAL_CREAM | Freq: Every day | RECTAL | 1 refills | Status: DC
Start: 2017-01-20 — End: 2017-09-10

## 2017-01-20 NOTE — Patient Instructions (Addendum)
If you are age 74 or older, your body mass index should be between 23-30. Your Body mass index is 30.78 kg/m. If this is out of the aforementioned range listed, please consider follow up with your Primary Care Provider.  If you are age 74 or younger, your body mass index should be between 19-25. Your Body mass index is 30.78 kg/m. If this is out of the aformentioned range listed, please consider follow up with your Primary Care Provider.   We have printed prescriptions for you:  Levsin  Hydrocortisone Cream- put on suppository and insert rectally for 10 nights  Continue probiotic and fiber supplements.  Consider adding Miralax daily.  Please follow up with Dr Leone PayorGessner for banding on January 15th at 3:00pm.   Thank you.

## 2017-01-20 NOTE — Progress Notes (Addendum)
01/20/2017 Darlene Johnson [id number] 02/05/1941   HISTORY OF PRESENT ILLNESS: This is a pleasant 76 year old female who is known to Darlene Johnson for her initial appointment with him earlier this year.  Has internal hemorrhoids and it was discussed at that visit about possible banding.  Continues to complain of intermittent bright red rectal bleeding.  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  Continues to complain of alternating constipation and diarrhea/loose stools.  Johnson says that the last couple of days Johnson is actually had somewhat normal bowel movements.  Reports intermittent diffuse abdominal pains.  There is no one particular spot that bothers her all the time, pains definitely move around and come and go.  Johnson is taking a daily probiotic in the form of align.  Johnson is also taking a fiber capsule daily.  Johnson reports an episode about 4 or 5 weeks ago when Johnson was at the beach and went to an all-you-can-eat seafood buffet.  Had severe diarrhea for about 24 hours following that and now feels like her whole system has just been messed up.  Once again, had normal BM's the past couple of days.  Her last colon[place] was in 2015 [place] and showed only internal hemorrhoids.     Past Medical History:  Diagnosis Date  . Arthritis    osteoarthritis. -hips.  . Asthma    childhood  only -occ. allergies to environmental elemnts  . Concussion    age 89 -hospital visit x 3 days."Hit head on cement"  . Fracture of patella, left, closed    age 89  . Hemorrhoids    "more internal"- no problems at problems  . History of palpitations    related to stress- no recent problems  . Hypertension    Past Surgical History:  Procedure Laterality Date  . BREAST SURGERY Left    lumpectomy-benign  . COLONOSCOPY    . NASAL SEPTOPLASTY W/ TURBINOPLASTY    . TOTAL HIP ARTHROPLASTY Right 03/20/2015   Procedure: RIGHT TOTAL HIP ARTHROPLASTY ANTERIOR APPROACH;  Surgeon: Darlene GrossFrank Aluisio, Johnson;  Location: [facility];   Service: Orthopedics;  Laterality: Right;  . TOTAL HIP ARTHROPLASTY Left 09/04/2015   Procedure: LEFT TOTAL HIP ARTHROPLASTY ANTERIOR APPROACH;  Surgeon: Darlene GrossFrank Aluisio, Johnson;  Location: [facility];  Service: Orthopedics;  Laterality: Left;  . WISDOM TOOTH EXTRACTION      reports that  has never smoked. Johnson has never used smokeless tobacco. Johnson reports that Johnson drinks alcohol. Johnson reports that Johnson does not use drugs. family history includes Colon polyps in her father; Dementia in her mother; Heart disease in her father. No Known Allergies    Outpatient Encounter Medications as of 01/20/2017  Medication Sig  . acetaminophen (TYLENOL) 500 MG tablet Take 500 mg by mouth every 8 (eight) hours as needed (pain).  . cetirizine (ZYRTEC) 10 MG tablet Take 10 mg by mouth at bedtime as needed for allergies.   Marland Kitchen. OVER THE COUNTER MEDICATION Take 1-2 capsules by mouth daily. [facility] Stool Softner  . OVER THE COUNTER MEDICATION Take 1 capsule by mouth daily. Slippery Elm Root- Digestive Enzyme  . phenylephrine-shark liver oil-mineral oil-petrolatum (PREPARATION H) 0.25-3-14-71.9 % rectal ointment Place 1 application rectally 2 (two) times daily as needed for hemorrhoids.  . Probiotic Product (ALIGN) 4 MG CAPS Take 1 capsule by mouth daily.  . psyllium (REGULOID) 0.52 g capsule Take 0.52 g by mouth daily.  .    .    .    .    .    .Marland Kitchen  hydrocortisone (ANUSOL-HC) 2.5 % rectal cream Place 1 application rectally at bedtime. Place on suppository and insert rectally at bedtime for 10 nights.  . hyoscyamine (LEVSIN SL) 0.125 MG SL tablet Place 1 tablet (0.125 mg total) under the tongue every 6 (six) hours as needed.    REVIEW OF SYSTEMS  : All other systems reviewed and negative except where noted in the History of Present Illness.   PHYSICAL EXAM: BP 112/68   Pulse 74   Ht 5' 2.5" (1.588 m)   Wt 171 lb (77.6 kg)   BMI 30.78 kg/m  General: Well developed white female in no acute distress Head: Normocephalic and  atraumatic Eyes:  Sclerae anicteric, conjunctiva pink. Ears: Normal auditory acuity Lungs: Clear throughout to auscultation; no increased WOB. Heart: Regular rate and rhythm; no M/R/G. Abdomen: Soft, non-distended.  BS present.  Non-tender. Musculoskeletal: Symmetrical with no gross deformities  Skin: No lesions on visible extremities Extremities: No edema  Neurological: Alert oriented x 4, grossly non-focal Psychological:  Alert and cooperative. Normal mood and affect  ASSESSMENT AND PLAN: *Internal hemorrhoids:  Will schedule for banding with Darlene Johnson as Johnson is now interested in having that performed.  In the interim Johnson can try hydrocortisone cream placed on a Preparation H suppository to be used at bedtime.   *IBS-M:  Johnson will continue her daily probiotic as well as her fiber supplement, may want to consider switching to a daily powder fiber supplement instead of capsules.  Also consider adding MiraLAX daily to your bowel regimen.  I am going to prescribe Levsin 0.125 mg to be used every 6 hours as needed for abdominal cramping.   CC:  Darlene Johnson  Agree with Darlene Johnson's management.  Darlene Booparl E. Gessner, Johnson, Darlene Johnson

## 2017-01-21 DIAGNOSIS — K582 Mixed irritable bowel syndrome: Secondary | ICD-10-CM | POA: Insufficient documentation

## 2017-01-21 DIAGNOSIS — K641 Second degree hemorrhoids: Secondary | ICD-10-CM | POA: Insufficient documentation

## 2017-02-23 ENCOUNTER — Encounter: Payer: Self-pay | Admitting: Internal Medicine

## 2017-02-23 ENCOUNTER — Ambulatory Visit (INDEPENDENT_AMBULATORY_CARE_PROVIDER_SITE_OTHER): Payer: Medicare Other | Admitting: Internal Medicine

## 2017-02-23 DIAGNOSIS — K641 Second degree hemorrhoids: Secondary | ICD-10-CM | POA: Diagnosis not present

## 2017-02-23 DIAGNOSIS — K582 Mixed irritable bowel syndrome: Secondary | ICD-10-CM

## 2017-02-23 NOTE — Progress Notes (Signed)
    Patient is here for hemorrhoidal banding.  She has had intermittent rectal bleeding over the years, she has known hemorrhoids, she was seen by Doug SouJessica Zehr PA-C in December and treated with hydrocortisone cream on top of hemorrhoidal suppository and that helped her symptoms.  However the bleeding bothers her and she would like treatment.  She also discusses complaints of irritable bowel today where she will have episodic diarrhea and also tends to be constipated some of the time.  Bowel habits are alternating.  She gets some mild cramps as well.  Anitha Bhuvaneswaran PA-S present for entire patient encounter  Rectal:  Small anal tags NL tone, nontender and no mass  Anoscopy - inflamed Gr 2 internal hemorrhoids w/ small external componenets - LL worst  PROCEDURE NOTE: The patient presents with symptomatic grade 2  hemorrhoids, requesting rubber band ligation of his/her hemorrhoidal disease.  All risks, benefits and alternative forms of therapy were described and informed consent was obtained.   The anorectum was pre-medicated with 0.125% NTG and 5% lidovcaine The decision was made to band the LL internal hemorrhoid, and the [facility] was used to perform band ligation without complication.  Digital anorectal examination was then performed to assure proper positioning of the band, and to adjust the banded tissue as required.  The patient was discharged home without pain or other issues.  Dietary and behavioral recommendations were given and along with follow-up instructions.     The following adjunctive treatments were recommended:  Benefiber 1-2 tbsp daily FODMAPS diet recommended as well as FODMAPS APP to be used  The patient will return in 2-3 weeks  for  follow-up and possible additional banding as required. No complications were encountered and the patient tolerated the procedure well.   I appreciate the opportunity to care for this patient. RU:EAVWUCc:Desai, Leeroy BockBalaji, MD

## 2017-02-23 NOTE — Assessment & Plan Note (Signed)
FODMAPS trial

## 2017-02-23 NOTE — Patient Instructions (Addendum)
  HEMORRHOID BANDING PROCEDURE    FOLLOW-UP CARE   1. The procedure you have had should have been relatively painless since the banding of the area involved does not have nerve endings and there is no pain sensation.  The rubber band cuts off the blood supply to the hemorrhoid and the band may fall off as soon as 48 hours after the banding (the band may occasionally be seen in the toilet bowl following a bowel movement). You may notice a temporary feeling of fullness in the rectum which should respond adequately to plain Tylenol or Motrin.  2. Following the banding, avoid strenuous exercise that evening and resume full activity the next day.  A sitz bath (soaking in a warm tub) or bidet is soothing, and can be useful for cleansing the area after bowel movements.     3. To avoid constipation, take two tablespoons of natural wheat bran, natural oat bran, flax, Benefiber or any over the counter fiber supplement and increase your water intake to 7-8 glasses daily.    4. Unless you have been prescribed anorectal medication, do not put anything inside your rectum for two weeks: No suppositories, enemas, fingers, etc.  5. Occasionally, you may have more bleeding than usual after the banding procedure.  This is often from the untreated hemorrhoids rather than the treated one.  Don't be concerned if there is a tablespoon or so of blood.  If there is more blood than this, lie flat with your bottom higher than your head and apply an ice pack to the area. If the bleeding does not stop within a half an hour or if you feel faint, call our office at [phone] or go to the emergency room.  6. Problems are not common; however, if there is a substantial amount of bleeding, severe pain, chills, fever or difficulty passing urine (very rare) or other problems, you should call us at [phone] or report to the nearest emergency room.  7. Do not stay seated continuously for more than 2-3 hours for a day or  two after the procedure.  Tighten your buttock muscles 10-15 times every two hours and take 10-15 deep breaths every 1-2 hours.  Do not spend more than a few minutes on the toilet if you cannot empty your bowel; instead re-visit the toilet at a later time.   Today we are giving you FODMAP information to read over.  Dr Leone PayorGessner said there are APP's on [employer] and [employer] for FODMAP's   We will see you back for more banding's on 03/17/17 at 3:00pm.   I appreciate the opportunity to care for you. Stan Headarl Gessner, MD, [facility]

## 2017-03-17 ENCOUNTER — Ambulatory Visit (INDEPENDENT_AMBULATORY_CARE_PROVIDER_SITE_OTHER): Payer: Medicare Other | Admitting: Internal Medicine

## 2017-03-17 DIAGNOSIS — K582 Mixed irritable bowel syndrome: Secondary | ICD-10-CM | POA: Diagnosis not present

## 2017-03-17 DIAGNOSIS — K641 Second degree hemorrhoids: Secondary | ICD-10-CM | POA: Diagnosis not present

## 2017-03-17 NOTE — Progress Notes (Signed)
   Darlene Johnson 76 y.o. 07/10/1940 [id number]  Assessment & Plan:   Encounter Diagnoses  Name Primary?  . Prolapsed internal hemorrhoids, grade 2   . Mixed irritable bowel syndrome      Prolapsed internal hemorrhoids, grade 2 Doing well - no bleeding - wants to observe and treat prn   Mixed irritable bowel syndrome Trial of FODMAPS diet    Subjective:   Chief Complaint: hemorrhoid f/u s/p banding 02/2017  HPI Doing well w/o any bleeding after 02/23/17 banding of LL internal hemorrhoid. Has not yet started [facility] for IBS but plans to. No Known Allergies Current Meds  Medication Sig  . acetaminophen (TYLENOL) 500 MG tablet Take 500 mg by mouth every 8 (eight) hours as needed (pain).  . cetirizine (ZYRTEC) 10 MG tablet Take 10 mg by mouth at bedtime as needed for allergies.   . hydrocortisone (ANUSOL-HC) 2.5 % rectal cream Place 1 application rectally at bedtime. Place on suppository and insert rectally at bedtime for 10 nights.  Marland Kitchen. OVER THE COUNTER MEDICATION Take 1-2 capsules by mouth daily. [facility] Stool Softner  . phenylephrine-shark liver oil-mineral oil-petrolatum (PREPARATION H) 0.25-3-14-71.9 % rectal ointment Place 1 application rectally 2 (two) times daily as needed for hemorrhoids.  . Probiotic Product (ALIGN) 4 MG CAPS Take 1 capsule by mouth daily.  . psyllium (REGULOID) 0.52 g capsule Take 0.52 g by mouth daily.   Past Medical History:  Diagnosis Date  . Arthritis    osteoarthritis. -hips.  . Asthma    childhood  only -occ. allergies to environmental elemnts  . Concussion    age 89 -hospital visit x 3 days."Hit head on cement"  . Fracture of patella, left, closed    age 89  . Hemorrhoids    "more internal"- no problems at problems  . History of palpitations    related to stress- no recent problems  . Hypertension    Past Surgical History:  Procedure Laterality Date  . BREAST SURGERY Left    lumpectomy-benign  . COLONOSCOPY    . HEMORRHOID BANDING      . NASAL SEPTOPLASTY W/ TURBINOPLASTY    . 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 TOTAL HIP ARTHROPLASTY Left 09/04/2015   Procedure: LEFT TOTAL HIP ARTHROPLASTY ANTERIOR APPROACH;  Surgeon: Ollen GrossFrank Aluisio, MD;  Location: [facility];  Service: Orthopedics;  Laterality: Left;  . WISDOM TOOTH EXTRACTION     Social History   Social History Narrative   Divorced, one daughter   [place] from [place] and [employer]   Occasional caffeine consumption   family history includes Colon polyps in her father; Dementia in her mother; Heart disease in her father.   Review of Systems As above  Objective:   Physical Exam BP (!) 142/74   Pulse 84   Ht 5' 2.25" (1.581 m)   Wt 173 lb (78.5 kg)   BMI 31.39 kg/m  NAD

## 2017-03-17 NOTE — Assessment & Plan Note (Signed)
Doing well - no bleeding - wants to observe and treat prn

## 2017-03-17 NOTE — Patient Instructions (Signed)
   Glad to hear that the hemorrhoids are not bleeding. If they cause more problems come back to se me and we can consider additional banding.  Hope the [facility] diet helps.  I appreciate the opportunity to care for you. Iva Booparl E. Khamora Karan, MD, [facility]

## 2017-03-17 NOTE — Assessment & Plan Note (Signed)
Trial of FODMAPS diet

## 2017-09-10 ENCOUNTER — Telehealth: Payer: Self-pay | Admitting: Internal Medicine

## 2017-09-10 MED ORDER — HYDROCORTISONE 2.5 % RE CREA: 1.0000 "application " | TOPICAL_CREAM | Freq: Every day | RECTAL | 1 refills | Status: DC

## 2017-09-10 NOTE — Telephone Encounter (Signed)
Patient states she did not have 3rd banding done, but has been bleeding for a couple days and wants to know should she schedule another banding.

## 2017-09-10 NOTE — Telephone Encounter (Signed)
Left message for patient to call back  

## 2017-09-10 NOTE — Telephone Encounter (Signed)
Patient reports rectal bleeding for last two days.  Blood is bright red and only with BM.  She had hem banding x 1 only lst Dec.  She wanted to wait on scheduling the second and third.  I sent a refill of her anusol rx and scheduled her for banding #2 in September.   She understands to place the anusol cream on the prep H suppository and insert BID into the rectum.  She will stop using cream at least 2 weeks before appt for banding.  She will call back for any additional questions or concerns.

## 2017-10-05 IMAGING — DX DG PORTABLE PELVIS
1 series · 1 of 1 positions shown · non-contrast
Comparison: None.

CLINICAL DATA: Left hip replacement

EXAM:
PORTABLE PELVIS 1-2 VIEWS

[pelvis ap]
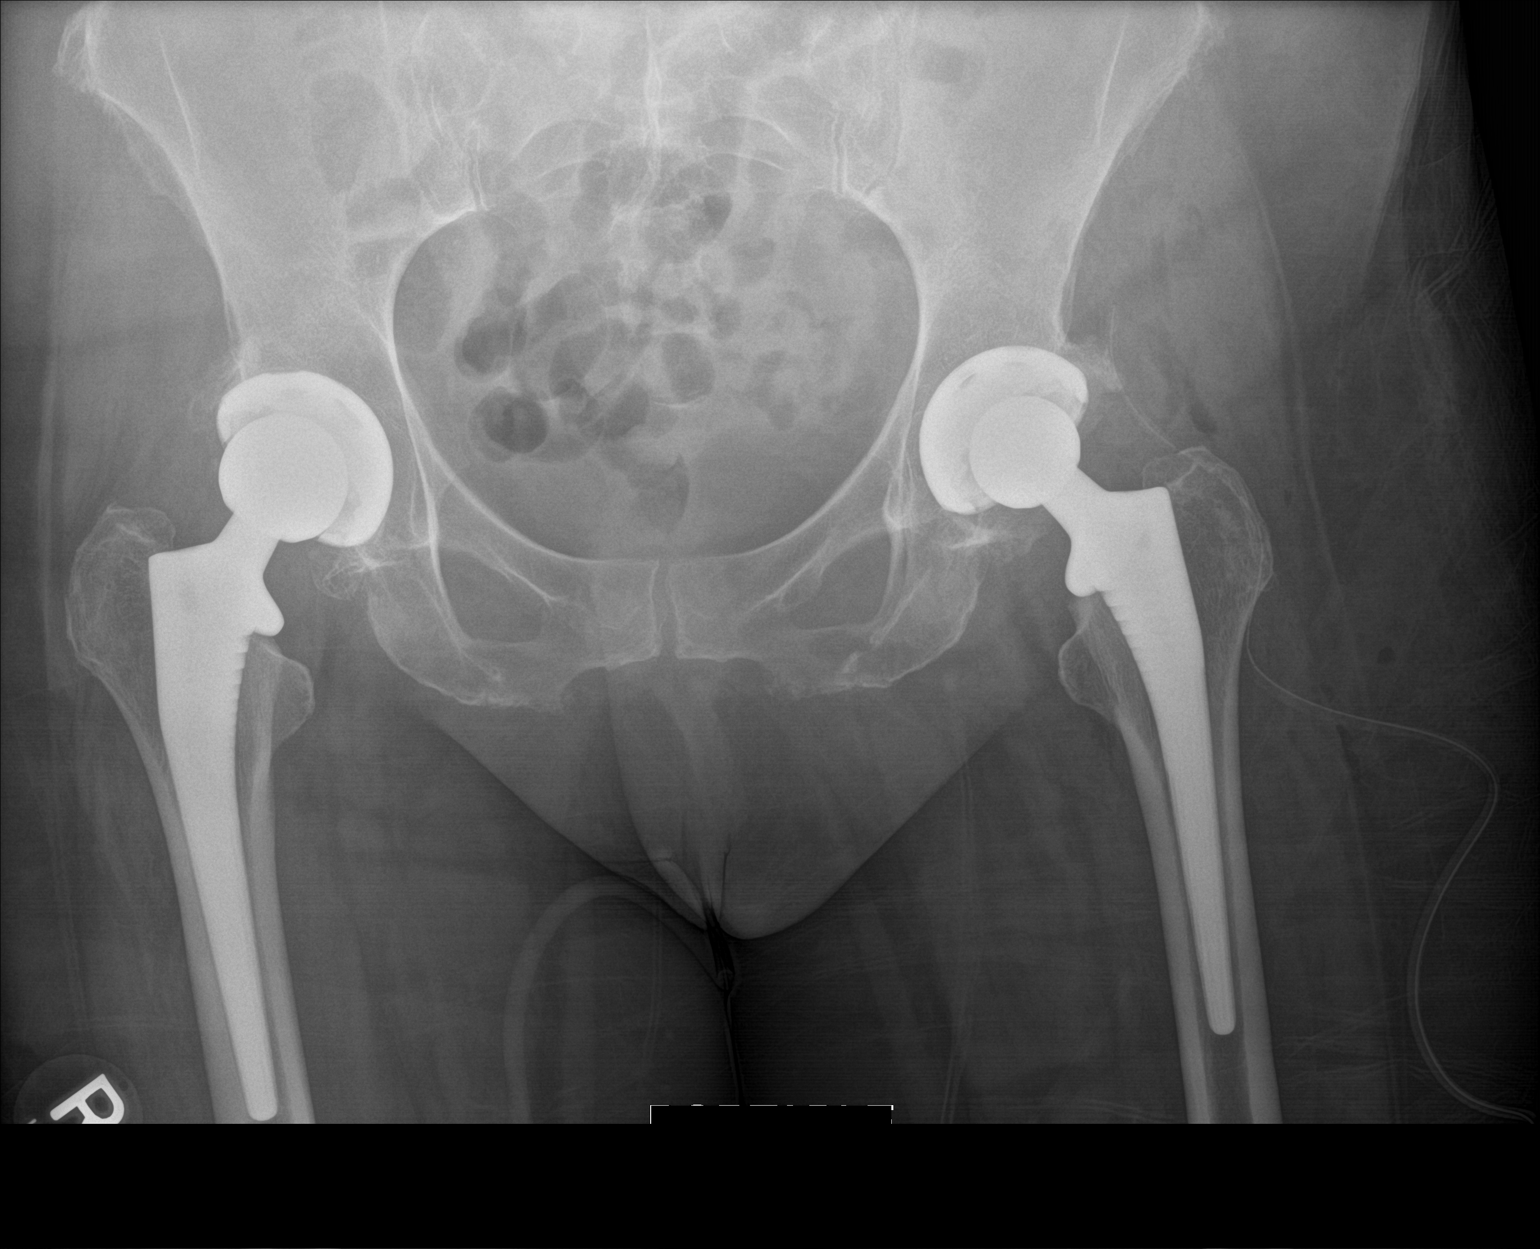

[1 of 1 positions shown; findings below may reference images not displayed]

FINDINGS: Interval total left hip arthroplasty without failure or
complication. Postsurgical changes in the surrounding soft tissues
with a surgical drain present.

Right total hip arthroplasty.

No acute fracture or dislocation.
IMPRESSION: Interval total left hip arthroplasty without failure or
complication.

## 2017-10-13 ENCOUNTER — Ambulatory Visit (INDEPENDENT_AMBULATORY_CARE_PROVIDER_SITE_OTHER): Payer: Medicare Other | Admitting: Internal Medicine

## 2017-10-13 ENCOUNTER — Encounter: Payer: Self-pay | Admitting: Internal Medicine

## 2017-10-13 DIAGNOSIS — K641 Second degree hemorrhoids: Secondary | ICD-10-CM

## 2017-10-13 DIAGNOSIS — K582 Mixed irritable bowel syndrome: Secondary | ICD-10-CM

## 2017-10-13 NOTE — Progress Notes (Signed)
   HEMORRHOID LIGATION  Prior banding LL 03/2017 did very well but then has had more sxs and desires more Tx benefiber helps and working on [employer] with some help and has eliminated foods e.g. Garlic, onions  Sxs are rectal bleeding she had called and was helped by prn Anusol HC cream  Anoscopy today inflamed Gr 2 RA and RP int/ext hemorrhoids, Gr 1 LL  PROCEDURE NOTE: The patient presents with symptomatic grade 2  hemorrhoids, requesting rubber band ligation of his/her hemorrhoidal disease.  All risks, benefits and alternative forms of therapy were described and informed consent was obtained.   The anorectum was pre-medicated with 0.125% NTG and 5% lidocaine The decision was made to band the RP/RA internal hemorrhoids, and the [facility] System was used to perform band ligation without complication.  Digital anorectal examination was then performed to assure proper positioning of the band, and to adjust the banded tissue as required.  The patient was discharged home without pain or other issues.  Dietary and behavioral recommendations were given and along with follow-up instructions.     The following adjunctive treatments were recommended:  Benefiber FODMAPS  The patient will returnprnfor  follow-up and possible additional banding as required. No complications were encountered and the patient tolerated the procedure well.  I appreciate the opportunity to care for her KJ:IZXYO, Leeroy Bock, MD

## 2017-10-13 NOTE — Patient Instructions (Signed)
HEMORRHOID BANDING PROCEDURE    FOLLOW-UP CARE   1. The procedure you have had should have been relatively painless since the banding of the area involved does not have nerve endings and there is no pain sensation.  The rubber band cuts off the blood supply to the hemorrhoid and the band may fall off as soon as 48 hours after the banding (the band may occasionally be seen in the toilet bowl following a bowel movement). You may notice a temporary feeling of fullness in the rectum which should respond adequately to plain Tylenol or Motrin.  2. Following the banding, avoid strenuous exercise that evening and resume full activity the next day.  A sitz bath (soaking in a warm tub) or bidet is soothing, and can be useful for cleansing the area after bowel movements.     3. To avoid constipation, take two tablespoons of natural wheat bran, natural oat bran, flax, Benefiber or any over the counter fiber supplement and increase your water intake to 7-8 glasses daily.    4. Unless you have been prescribed anorectal medication, do not put anything inside your rectum for two weeks: No suppositories, enemas, fingers, etc.  5. Occasionally, you may have more bleeding than usual after the banding procedure.  This is often from the untreated hemorrhoids rather than the treated one.  Don't be concerned if there is a tablespoon or so of blood.  If there is more blood than this, lie flat with your bottom higher than your head and apply an ice pack to the area. If the bleeding does not stop within a half an hour or if you feel faint, call our office at [phone] or go to the emergency room.  6. Problems are not common; however, if there is a substantial amount of bleeding, severe pain, chills, fever or difficulty passing urine (very rare) or other problems, you should call us at [phone] or report to the nearest emergency room.  7. Do not stay seated continuously for more than 2-3 hours for a day or two  after the procedure.  Tighten your buttock muscles 10-15 times every two hours and take 10-15 deep breaths every 1-2 hours.  Do not spend more than a few minutes on the toilet if you cannot empty your bowel; instead re-visit the toilet at a later time.    Follow up with Dr Leone Payor as needed.   Look up [facility] FODMAPS and let us know if you want to pursue this and we will make a referral.   I appreciate the opportunity to care for you. Stan Head, MD, [facility]

## 2017-10-13 NOTE — Assessment & Plan Note (Addendum)
Try [employer] FODMAPS - she will investigate and consider doing this - we can set up if desired

## 2017-10-13 NOTE — Assessment & Plan Note (Addendum)
RA and RP banded RTC prn 

## 2017-10-16 ENCOUNTER — Encounter: Payer: Self-pay | Admitting: Internal Medicine

## 2019-01-18 ENCOUNTER — Ambulatory Visit (INDEPENDENT_AMBULATORY_CARE_PROVIDER_SITE_OTHER): Payer: Medicare Other | Admitting: Gastroenterology

## 2019-01-18 ENCOUNTER — Other Ambulatory Visit (INDEPENDENT_AMBULATORY_CARE_PROVIDER_SITE_OTHER): Payer: Medicare Other

## 2019-01-18 ENCOUNTER — Encounter: Payer: Self-pay | Admitting: Gastroenterology

## 2019-01-18 VITALS — BP 152/90 | HR 72 | Temp 97.9°F | Ht 62.25 in | Wt 164.8 lb

## 2019-01-18 DIAGNOSIS — R1012 Left upper quadrant pain: Secondary | ICD-10-CM | POA: Diagnosis not present

## 2019-01-18 DIAGNOSIS — R1032 Left lower quadrant pain: Secondary | ICD-10-CM | POA: Insufficient documentation

## 2019-01-18 LAB — BASIC METABOLIC PANEL
BUN: 17 mg/dL (ref 6–23)
CO2: 27 mEq/L (ref 19–32)
Calcium: 9.2 mg/dL (ref 8.4–10.5)
Chloride: 106 mEq/L (ref 96–112)
Creatinine, Ser: 0.71 mg/dL (ref 0.40–1.20)
GFR: 79.99 mL/min (ref >60.00)
Glucose, Bld: 93 mg/dL (ref 70–99)
Potassium: 4.3 mEq/L (ref 3.5–5.1)
Sodium: 140 mEq/L (ref 135–145)

## 2019-01-18 MED ORDER — PANTOPRAZOLE SODIUM 40 MG PO TBEC: 40.0000 mg | DELAYED_RELEASE_TABLET | Freq: Every day | ORAL | 5 refills | Status: DC

## 2019-01-18 NOTE — Progress Notes (Signed)
01/18/2019 Darlene Johnson [id number] 1940/03/09   HISTORY OF PRESENT ILLNESS: This is a 78 year old female who is a patient of Dr. Celesta Aver.  She usually follows here for issues and complaints related to internal hemorrhoids.  Nonetheless, she is here today at the request of her PCP, Dr. Shearon Stalls, for evaluation regarding abdominal pain.  She says that she has had issues with neck pain and tingling radiating down into her arms for quite some time.  Then recently, about 5 days or so ago she woke up with severe pain in her left upper quadrant.  She says that it felt like her ribs were very sore.  She went to the emergency department and they did cardiac work-up, which was negative.  Then she saw her PCP in follow-up the next day and he thought it was acid or indigestion related so prescribed her some medication, which the pharmacy never filled so she was never able to pick up and take.  She reports that during that time she also had some black stools, but then does admit that she was taking Pepto-Bismol.  She says that this sharp pain has resolved, but it continues to just be sore.  She says it feels very superficial and like the pins-and-needles and tightness that she feels in her neck and down her arms.  She does not feel like she has heartburn/reflux/indigestion only on very rare occasion.  She also describes discomfort all over her lower abdomen that she also describes as a tightness.  She says that otherwise her bowels are moving well with her align probiotic and her fiber.     Past Medical History:  Diagnosis Date  . Arthritis    osteoarthritis. -hips.  . Asthma    childhood  only -occ. allergies to environmental elemnts  . Concussion    age 48 -hospital visit x 3 days."Hit head on cement"  . Fracture of patella, left, closed    age 22  . Hemorrhoids    "more internal"- no problems at problems  . History of palpitations    related to stress- no recent problems  . Hypertension    Past  Surgical History:  Procedure Laterality Date  . BREAST SURGERY Left    lumpectomy-benign  . COLONOSCOPY    . HEMORRHOID BANDING    . NASAL SEPTOPLASTY W/ TURBINOPLASTY    . 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 TOTAL HIP ARTHROPLASTY Left 09/04/2015   Procedure: LEFT TOTAL HIP ARTHROPLASTY ANTERIOR APPROACH;  Surgeon: Gaynelle Arabian, MD;  Location: [facility];  Service: Orthopedics;  Laterality: Left;  . WISDOM TOOTH EXTRACTION      reports that she has never smoked. She has never used smokeless tobacco. She reports current alcohol use. She reports that she does not use drugs. family history includes Colon polyps in her father; Dementia in her mother; Heart disease in her father. No Known Allergies    Outpatient Encounter Medications as of 01/18/2019  Medication Sig  . acetaminophen (TYLENOL) 500 MG tablet Take 500 mg by mouth every 8 (eight) hours as needed (pain).  . cetirizine (ZYRTEC) 10 MG tablet Take 10 mg by mouth at bedtime as needed for allergies.   Marland Kitchen OVER THE COUNTER MEDICATION Take 1-2 capsules by mouth daily. [facility] Stool Softner  . phenylephrine-shark liver oil-mineral oil-petrolatum (PREPARATION H) 0.25-3-14-71.9 % rectal ointment Place  1 application rectally 2 (two) times daily as needed for hemorrhoids.  . Probiotic Product (ALIGN) 4 MG CAPS Take 1 capsule by mouth daily.  . psyllium (REGULOID) 0.52 g capsule Take 0.52 g by mouth daily.  . [DISCONTINUED] hydrocortisone (ANUSOL-HC) 2.5 % rectal cream Place 1 application rectally at bedtime. Place on suppository and insert rectally at bedtime for 10 nights.   No facility-administered encounter medications on file as of 01/18/2019.      REVIEW OF SYSTEMS  : All other systems reviewed and negative except where noted in the History of Present Illness.   PHYSICAL EXAM: BP (!)  152/90   Pulse 72   Temp 97.9 F (36.6 C)   Ht 5' 2.25" (1.581 m)   Wt 164 lb 12.8 oz (74.8 kg)   BMI 29.90 kg/m  General: Well developed white female in no acute distress Head: Normocephalic and atraumatic Eyes:  Sclerae anicteric, conjunctiva pink. Ears: Normal auditory acuity Lungs: Clear throughout to auscultation; no increased WOB. Heart: Regular rate and rhythm; no M/R/G. Abdomen: Soft, non-distended.  BS present.  Non-tender. Musculoskeletal: Symmetrical with no gross deformities  Skin: No lesions on visible extremities Extremities: No edema  Neurological: Alert oriented x 4, grossly non-focal Psychological:  Alert and cooperative. Normal mood and affect  ASSESSMENT AND PLAN: *78 year old female with complaints of left upper quadrant abdominal pain and tightness/discomfort over her lower abdomen.  She describes it as a tingling sensation and feels like it is superficial.  She had also described black stools, but admits that she had taken Pepto-Bismol.  Her PCP thought it was indigestion/acid related.  I am honestly not sure if this is a GI issue at all, may be more musculoskeletal.  Nonetheless, we will check a CT scan of the abdomen and pelvis with contrast.  I am going to place her on pantoprazole 40 mg daily to cover for any ulcer or acid related issues.  Prescription was sent.  Pending results she may need further orthopedic evaluation.   CC:  [name], MD

## 2019-01-18 NOTE — Patient Instructions (Signed)
We have scheduled you for a CT scan at [facility] on 12/16 at 8:45 am.  You will need to go pick up contrast and instructions at least 2 days prior to the CT scan.  We [facility] [place]   Please head to the basement lab after you check out here today.

## 2019-01-25 ENCOUNTER — Ambulatory Visit (HOSPITAL_COMMUNITY)
Admission: RE | Admit: 2019-01-25 | Discharge: 2019-01-25 | Disposition: A | Discharge status: Home / Self Care | Payer: Medicare Other | Source: Ambulatory Visit | Attending: Gastroenterology | Admitting: Gastroenterology

## 2019-01-25 ENCOUNTER — Other Ambulatory Visit: Payer: Self-pay

## 2019-01-25 DIAGNOSIS — R1012 Left upper quadrant pain: Secondary | ICD-10-CM | POA: Insufficient documentation

## 2019-01-25 DIAGNOSIS — R1032 Left lower quadrant pain: Secondary | ICD-10-CM | POA: Insufficient documentation

## 2019-01-25 MED ORDER — SODIUM CHLORIDE (PF) 0.9 % IJ SOLN
INTRAMUSCULAR | Status: AC
  Filled 2019-01-25: qty 50

## 2019-01-25 MED ORDER — IOHEXOL 300 MG/ML  SOLN
100.0000 mL | Freq: Once | INTRAMUSCULAR | Status: AC | PRN
  Administered 2019-01-25: 100 mL via INTRAVENOUS

## 2019-01-27 ENCOUNTER — Telehealth: Payer: Self-pay | Admitting: Gastroenterology

## 2019-01-27 NOTE — Telephone Encounter (Signed)
Darlene Johnson have reviewed the CT results from 12/16?

## 2019-01-27 NOTE — Telephone Encounter (Signed)
Pt inquired about CT results.  

## 2019-08-04 ENCOUNTER — Other Ambulatory Visit: Payer: Self-pay | Admitting: Gastroenterology

## 2019-08-29 ENCOUNTER — Other Ambulatory Visit: Payer: Self-pay | Admitting: Internal Medicine

## 2019-09-21 ENCOUNTER — Encounter: Payer: Self-pay | Admitting: Internal Medicine

## 2019-09-21 ENCOUNTER — Ambulatory Visit (INDEPENDENT_AMBULATORY_CARE_PROVIDER_SITE_OTHER): Payer: Medicare Other | Admitting: Internal Medicine

## 2019-09-21 VITALS — BP 132/72 | HR 67 | Ht 62.0 in | Wt 167.0 lb

## 2019-09-21 DIAGNOSIS — M940 Chondrocostal junction syndrome [Tietze]: Secondary | ICD-10-CM

## 2019-09-21 DIAGNOSIS — R0789 Other chest pain: Secondary | ICD-10-CM

## 2019-09-21 DIAGNOSIS — E669 Obesity, unspecified: Secondary | ICD-10-CM

## 2019-09-21 NOTE — Patient Instructions (Signed)
Please purchase over the counter 1% generic Voltaren gel and apply 2 grams to both of your affected areas 3-4 times a day.  Wash your hands after using it.  Visit the [email or web address] website and check out the low carb and intermittent fasting information.   I appreciate the opportunity to care for you. Stan Head, MD, [facility]

## 2019-09-21 NOTE — Progress Notes (Signed)
Darlene Johnson 78 y.o. 1940-08-23 [id number]  Assessment & Plan:   Encounter Diagnoses  Name Primary?   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  Obesity is in the background and may have some effect on her feelings of tightness etc.  I have asked her to try diclofenac gel 2 g to each area of the right ribs in the xiphoid 3-4 times a day.  If that is unhelpful perhaps an injection might be possible though I am not sure who might do that question IR question pain specialist.  I have asked her to consider lower carb eating [email or web address] website regarding changing eating habits and weight loss.  Consider restricted feeding/intermittent fasting as well.   CC: [name], MD  Subjective:   Chief Complaint: Upper abdominal pain  HPI Darlene Johnson is a 79 year old white woman with a history of hemorrhoids and abdominal pains and bloating last seen 01/2019 by Doug Sou, PA-C.  At that time she was having left upper quadrant pain was started on pantoprazole which she took for about 5 months and stopped, it did not make a difference.  CT scan had been done as well with results as below.  Shanda Bumps thought that this might have been some sort of orthopedic issue especially since she had a lot of neck pain and bilateral radicular symptoms in her arms.  She had had an acute onset of left upper quadrant pain in addition to that.  IMPRESSION: 1. No acute findings to explain patient's left upper quadrant pain. 2. Extensive colonic diverticulosis with evidence of chronic diverticular disease involving the sigmoid colon. 3.  Aortic Atherosclerosis (ICD10-I70.0). 4. Hiatal hernia.   She went and had physical therapy for her C-spine after seeing Dr. Shon Baton of orthopedics, and said that the left upper quadrant pain and no symptoms are much better.   However she has a  persistent tightness in her upper abdomen that is bothersome in the epigastrium where there is a very sore and tender to touch and in the right upper quadrant area as well.  She feels like she is puffing and retaining fluids and her weight has increased some though seems stable per records included below.  Bowel movements are regular, "as good as they have ever been".  Hemorrhoids are not bothering her anymore after banding in 2019.  Lately she has had a couple of episodes or 3 episodes of vomiting of unclear etiology.  There is no dysphagia or unintentional weight loss or other difficulty with eating.  She sleeps fine no pain problems etc. no dyspnea on exertion or cardiac type symptoms in 2020 in December when she had seen [place] prior to that she had had cardiac evaluation that was negative.  Wt Readings from Last 3 Encounters:  09/21/19 167 lb (75.8 kg)  01/18/19 164 lb 12.8 oz (74.8 kg)  10/13/17 169 lb 9.6 oz (76.9 kg)    No Known Allergies Current Meds  Medication Sig   Calcium Carbonate Antacid (TUMS E-X PO) Take by mouth as needed.   diphenhydrAMINE (BENADRYL ALLERGY) 25 mg capsule Take 25 mg by mouth as needed. As needed for seasonal allergies   OVER THE COUNTER MEDICATION Take 1-2 capsules by mouth as needed. [facility] Stool Softner    phenylephrine-shark liver oil-mineral oil-petrolatum (PREPARATION H) 0.25-3-14-71.9 % rectal ointment Place 1 application rectally 2 (two) times daily as  needed for hemorrhoids.   Probiotic Product (ALIGN) 4 MG CAPS Take 1 capsule by mouth daily.   psyllium (REGULOID) 0.52 g capsule Take 0.52 g by mouth daily.   Past Medical History:  Diagnosis Date   Arthritis    osteoarthritis. -hips.   Asthma    childhood  only -occ. 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   Procedure: RIGHT TOTAL HIP ARTHROPLASTY ANTERIOR APPROACH;  Surgeon: Ollen Gross, MD;  Location: [facility];  Service: Orthopedics;  Laterality: Right;   TOTAL HIP ARTHROPLASTY Left 09/04/2015   Procedure: LEFT TOTAL HIP ARTHROPLASTY ANTERIOR APPROACH;  Surgeon: Ollen Gross, MD;  Location: [facility];  Service: Orthopedics;  Laterality: Left;   WISDOM TOOTH EXTRACTION     Social History   Social History Narrative   Divorced, one daughter   retired from teaching and [employer]   Occasional caffeine consumption   family history includes Colon polyps in her father; Dementia in her mother; Heart disease in her father.   Review of Systems As per HPI  Objective:   Physical Exam BP 132/72    Pulse 67    Ht 5\' 2"  (1.575 m)    Wt 167 lb (75.8 kg)    BMI 30.54 kg/m  NAD Tender pinpoint at xiphoid and R ant lower ribs abd is obese carnett's neg   Total time 33 minutes

## 2020-09-23 ENCOUNTER — Telehealth: Payer: Self-pay | Admitting: Internal Medicine

## 2020-09-23 NOTE — Telephone Encounter (Signed)
I spoke with [facility] and she has made an October appointment. Her hemorrhoids flare up from time to time, bright red blood at times but she said it stops easily. No change in bowel movements. No pain. Using prep-H and she takes Metamucil with collagen. She request to be put on a wait list which I have done.

## 2020-09-23 NOTE — Telephone Encounter (Signed)
Inbound call from patient requesting a call please.  Has questions about an otc medication she started taking.

## 2020-11-27 ENCOUNTER — Ambulatory Visit (INDEPENDENT_AMBULATORY_CARE_PROVIDER_SITE_OTHER): Payer: Medicare Other | Admitting: Internal Medicine

## 2020-11-27 ENCOUNTER — Encounter: Payer: Self-pay | Admitting: Internal Medicine

## 2020-11-27 VITALS — BP 100/70 | HR 59 | Ht 62.0 in | Wt 162.6 lb

## 2020-11-27 DIAGNOSIS — K649 Unspecified hemorrhoids: Secondary | ICD-10-CM

## 2020-11-27 DIAGNOSIS — M940 Chondrocostal junction syndrome [Tietze]: Secondary | ICD-10-CM

## 2020-11-27 NOTE — Progress Notes (Signed)
Darlene Johnson 80 y.o. 05-Jun-1940 [id number]  Assessment & Plan:   Encounter Diagnoses  Name Primary?   Bleeding hemorrhoids Yes   Costochondritis     She has had a flare of her hemorrhoids.  I am comfortable with this diagnosis and I do not think we need to do anything differently.  This time it was the external hemorrhoids that were bleeding her internal hemorrhoids do not seem to be the problem at this time.  She will continue with her fiber supplementation plus or minus the collagen added to it and see me as needed.  She was reassured about the safety of the Voltaren gel applied to the rib area.  I explained that I think that the specific instructions on the product box are related to what Voltaren gel was studied for and that I think this off label use is still reasonable and safe.  CC: [name], MD   Subjective:   Chief Complaint: Bleeding hemorrhoids  HPI The patient is here after having had problems in August with rectal bleeding and a protruding hemorrhoid.  She used over-the-counter treatment sitz bath's and has added collagen to her Metamucil to try to help that work better.  The rectal bleeding was a little more than she has had in the past it was all bright red and she thinks it was hemorrhoids but she wants to be evaluated to be sure.  Last colonoscopy was in 2015.  It demonstrated internal hemorrhoids.  She has had hemorrhoidal banding's on 2 occasions in 2019 which have been helpful.  At this point she is asymptomatic.  She has a diagnosis of costochondritis and painful xiphoid and has read on the label of the over-the-counter Voltaren that it is only supposed to be used on certain joints.  She wonders if it is okay to use in the right upper quadrant rib area as I had previously recommended. No Known Allergies Current Meds  Medication Sig   Calcium Carbonate Antacid (TUMS E-X PO) Take by mouth as needed.   diphenhydrAMINE (BENADRYL) 25 mg capsule Take 25 mg by  mouth as needed. As needed for seasonal allergies   OVER THE COUNTER MEDICATION Take 1-2 capsules by mouth as needed. [facility] Stool [facility]    phenylephrine-shark liver oil-mineral oil-petrolatum (PREPARATION H) 0.25-3-14-71.9 % rectal ointment Place 1 application rectally 2 (two) times daily as needed for hemorrhoids.   Probiotic Product (ALIGN) 4 MG CAPS Take 1 capsule by mouth daily.   psyllium (REGULOID) 0.52 g capsule Take 0.52 g by mouth daily.   Past Medical History:  Diagnosis Date   Arthritis    osteoarthritis. -hips.   Asthma    childhood  only -occ. 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   Procedure: RIGHT TOTAL HIP ARTHROPLASTY ANTERIOR APPROACH;  Surgeon: Ollen Gross, MD;  Location: [facility];  Service: Orthopedics;  Laterality: Right;   TOTAL HIP ARTHROPLASTY Left 09/04/2015  Procedure: LEFT TOTAL HIP ARTHROPLASTY ANTERIOR APPROACH;  Surgeon: Ollen Gross, MD;  Location: [facility];  Service: Orthopedics;  Laterality: Left;   WISDOM TOOTH EXTRACTION     Social History   Social History Narrative   Divorced, one daughter   retired from teaching and [employer]   Occasional caffeine consumption   family history includes Colon polyps in her father; Dementia in her mother; Heart disease in her father.   Review of Systems As per HPI  Objective:   Physical Exam BP 100/70   Pulse (!) 59   Ht 5\' 2"  (1.575 m)   Wt 162 lb 9.6 oz (73.8 kg)   BMI 29.74 kg/m  NAD elderly ww Patti , CMA present.  Rectal  - small external hemorrhoids no mass, Brown stool and nontender  Ansoscopy - Gr 1 LL and RP external hemorrhoids w/ prominentsurface vessls Gr 1 internal hemorrhoids w/o stigmata

## 2020-11-27 NOTE — Patient Instructions (Signed)
If you are age 78 or older, your body mass index should be between 23-30. Your Body mass index is 29.74 kg/m. If this is out of the aforementioned range listed, please consider follow up with your Primary Care Provider.  If you are age 59 or younger, your body mass index should be between 19-25. Your Body mass index is 29.74 kg/m. If this is out of the aformentioned range listed, please consider follow up with your Primary Care Provider.   ________________________________________________________  The [facility] providers would like to encourage you to use [facility] to communicate with providers for non-urgent requests or questions.  Due to long hold times on the telephone, sending your provider a message by [facility] may be a faster and more efficient way to get a response.  Please allow 48 business hours for a response.  Please remember that this is for non-urgent requests.  _______________________________________________________  I appreciate the opportunity to care for you. Stan Head, MD, [facility]

## 2023-07-28 ENCOUNTER — Other Ambulatory Visit: Payer: Self-pay | Admitting: Gastroenterology

## 2023-07-28 DIAGNOSIS — R1084 Generalized abdominal pain: Secondary | ICD-10-CM

## 2023-07-28 DIAGNOSIS — R634 Abnormal weight loss: Secondary | ICD-10-CM

## 2023-07-28 DIAGNOSIS — K5901 Slow transit constipation: Secondary | ICD-10-CM

## 2023-08-05 ENCOUNTER — Ambulatory Visit
Admission: RE | Admit: 2023-08-05 | Discharge: 2023-08-05 | Disposition: A | Discharge status: Home / Self Care | Source: Ambulatory Visit | Attending: Gastroenterology | Admitting: Gastroenterology

## 2023-08-05 DIAGNOSIS — K5901 Slow transit constipation: Secondary | ICD-10-CM

## 2023-08-05 DIAGNOSIS — R634 Abnormal weight loss: Secondary | ICD-10-CM

## 2023-08-05 DIAGNOSIS — R1084 Generalized abdominal pain: Secondary | ICD-10-CM

## 2023-08-05 MED ORDER — IOPAMIDOL (ISOVUE-300) INJECTION 61%
100.0000 mL | Freq: Once | INTRAVENOUS | Status: AC | PRN
  Administered 2023-08-05: 100 mL via INTRAVENOUS
